# Patient Record
Sex: Female | Born: 1985 | Race: Asian | Hispanic: No | Marital: Married | Smoking: Never smoker
Health system: Southern US, Community
[De-identification: ages and names within clinical notes are randomized; demographics above are authoritative.]

## PROBLEM LIST (undated history)

## (undated) DIAGNOSIS — O99019 Anemia complicating pregnancy, unspecified trimester: Secondary | ICD-10-CM

## (undated) DIAGNOSIS — D509 Iron deficiency anemia, unspecified: Secondary | ICD-10-CM

## (undated) DIAGNOSIS — Z789 Other specified health status: Secondary | ICD-10-CM

## (undated) DIAGNOSIS — D62 Acute posthemorrhagic anemia: Secondary | ICD-10-CM

## (undated) HISTORY — PX: HERNIA REPAIR: SHX51

---

## 2007-10-04 ENCOUNTER — Emergency Department (HOSPITAL_COMMUNITY): Admission: EM | Admit: 2007-10-04 | Discharge: 2007-10-05 | Payer: Self-pay | Admitting: Emergency Medicine

## 2011-10-10 LAB — BASIC METABOLIC PANEL
BUN: 7
Creatinine, Ser: 0.75
GFR calc Af Amer: >60
GFR calc non Af Amer: >60
Potassium: 3.3 — ABNORMAL LOW
Sodium: 135

## 2011-10-10 LAB — URINALYSIS, ROUTINE W REFLEX MICROSCOPIC
Bilirubin Urine: NEGATIVE
Glucose, UA: NEGATIVE
Hgb urine dipstick: NEGATIVE
Nitrite: NEGATIVE
Urobilinogen, UA: 0.2
pH: 7.5

## 2011-10-10 LAB — CBC
MCHC: 34.4
RBC: 4.4
RDW: 11.9

## 2011-10-10 LAB — DIFFERENTIAL
Basophils Absolute: 0
Lymphocytes Relative: 31
Monocytes Absolute: 0.5

## 2011-10-10 LAB — PREGNANCY, URINE: Preg Test, Ur: NEGATIVE

## 2013-08-26 ENCOUNTER — Other Ambulatory Visit: Payer: Self-pay | Admitting: Chiropractic Medicine

## 2013-08-26 ENCOUNTER — Ambulatory Visit
Admission: RE | Admit: 2013-08-26 | Discharge: 2013-08-26 | Disposition: A | Discharge status: Home / Self Care | Payer: BC Managed Care – PPO | Source: Ambulatory Visit | Attending: Chiropractic Medicine | Admitting: Chiropractic Medicine

## 2013-08-26 DIAGNOSIS — S93401A Sprain of unspecified ligament of right ankle, initial encounter: Secondary | ICD-10-CM

## 2013-12-30 NOTE — L&D Delivery Note (Signed)
Delivery Note At 7:08 AM a viable healthy but SGA female was delivered via Vaginal, Spontaneous Delivery (Presentation: Right Occiput Anterior).  APGAR: 8 and 9; weight 5 lb 7 oz.   Placenta status: Intact, Spontaneous Pathology.  Cord: 3 vessels with the following complications: None.  Cord pH: N/A  Anesthesia: Epidural  Episiotomy: Right Mediolateral 1.5 cm long  Lacerations: None Suture Repair: 3.0 vicryl rapide Est. Blood Loss (mL):  300 cc plus old clots about 100 cc   Mom to postpartum.  Baby to Couplet care / Skin to Skin.  Jamara Vary R 10/30/2014, 7:49 AM

## 2014-04-12 LAB — OB RESULTS CONSOLE RPR: RPR: NONREACTIVE

## 2014-04-12 LAB — OB RESULTS CONSOLE HIV ANTIBODY (ROUTINE TESTING): HIV: NONREACTIVE

## 2014-04-12 LAB — OB RESULTS CONSOLE HEPATITIS B SURFACE ANTIGEN: HEP B S AG: NEGATIVE

## 2014-04-12 LAB — OB RESULTS CONSOLE RUBELLA ANTIBODY, IGM: Rubella: IMMUNE

## 2014-04-21 LAB — OB RESULTS CONSOLE GC/CHLAMYDIA
CHLAMYDIA, DNA PROBE: NEGATIVE
GC PROBE AMP, GENITAL: NEGATIVE

## 2014-07-13 ENCOUNTER — Inpatient Hospital Stay (HOSPITAL_COMMUNITY)
Admission: AD | Admit: 2014-07-13 | Payer: BC Managed Care – PPO | Source: Ambulatory Visit | Admitting: Obstetrics and Gynecology

## 2014-10-12 LAB — OB RESULTS CONSOLE GBS: GBS: NEGATIVE

## 2014-10-29 ENCOUNTER — Inpatient Hospital Stay (HOSPITAL_COMMUNITY): Payer: BC Managed Care – PPO | Admitting: Anesthesiology

## 2014-10-29 ENCOUNTER — Encounter (HOSPITAL_COMMUNITY): Payer: BC Managed Care – PPO | Admitting: Anesthesiology

## 2014-10-29 ENCOUNTER — Inpatient Hospital Stay (HOSPITAL_COMMUNITY)
Admission: AD | Admit: 2014-10-29 | Discharge: 2014-11-01 | DRG: 774 | Disposition: A | Discharge status: Home / Self Care | Payer: BC Managed Care – PPO | Source: Ambulatory Visit | Attending: Obstetrics & Gynecology | Admitting: Obstetrics & Gynecology

## 2014-10-29 ENCOUNTER — Observation Stay (HOSPITAL_COMMUNITY): Payer: BC Managed Care – PPO

## 2014-10-29 ENCOUNTER — Encounter (HOSPITAL_COMMUNITY): Payer: Self-pay

## 2014-10-29 DIAGNOSIS — O9081 Anemia of the puerperium: Secondary | ICD-10-CM | POA: Diagnosis not present

## 2014-10-29 DIAGNOSIS — O358XX0 Maternal care for other (suspected) fetal abnormality and damage, not applicable or unspecified: Secondary | ICD-10-CM | POA: Diagnosis present

## 2014-10-29 DIAGNOSIS — Z3A38 38 weeks gestation of pregnancy: Secondary | ICD-10-CM | POA: Diagnosis present

## 2014-10-29 DIAGNOSIS — O4593 Premature separation of placenta, unspecified, third trimester: Secondary | ICD-10-CM | POA: Diagnosis present

## 2014-10-29 DIAGNOSIS — O1403 Mild to moderate pre-eclampsia, third trimester: Secondary | ICD-10-CM | POA: Diagnosis present

## 2014-10-29 DIAGNOSIS — O4690 Antepartum hemorrhage, unspecified, unspecified trimester: Secondary | ICD-10-CM | POA: Diagnosis present

## 2014-10-29 DIAGNOSIS — D62 Acute posthemorrhagic anemia: Secondary | ICD-10-CM | POA: Diagnosis not present

## 2014-10-29 DIAGNOSIS — O4693 Antepartum hemorrhage, unspecified, third trimester: Secondary | ICD-10-CM

## 2014-10-29 HISTORY — DX: Other specified health status: Z78.9

## 2014-10-29 LAB — CBC
HCT: 37.2 % (ref 36.0–46.0)
HCT: 32.5 % — ABNORMAL LOW (ref 36.0–46.0)
Hemoglobin: 13.4 g/dL (ref 12.0–15.0)
Hemoglobin: 11.4 g/dL — ABNORMAL LOW (ref 12.0–15.0)
MCH: 32.3 pg (ref 26.0–34.0)
MCH: 31.4 pg (ref 26.0–34.0)
MCHC: 36 g/dL (ref 30.0–36.0)
MCHC: 35.1 g/dL (ref 30.0–36.0)
MCV: 89.6 fL (ref 78.0–100.0)
MCV: 89.5 fL (ref 78.0–100.0)
PLATELETS: 169 10*3/uL (ref 150–400)
Platelets: 172 10*3/uL (ref 150–400)
RBC: 4.15 MIL/uL (ref 3.87–5.11)
RBC: 3.63 MIL/uL — AB (ref 3.87–5.11)
RDW: 12.8 % (ref 11.5–15.5)
RDW: 12.7 % (ref 11.5–15.5)
WBC: 12.6 10*3/uL — ABNORMAL HIGH (ref 4.0–10.5)
WBC: 11.8 10*3/uL — AB (ref 4.0–10.5)

## 2014-10-29 LAB — PROTEIN / CREATININE RATIO, URINE
CREATININE, URINE: 47.89 mg/dL
PROTEIN CREATININE RATIO: 0.1 (ref 0.00–0.15)
Total Protein, Urine: 4.9 mg/dL

## 2014-10-29 LAB — FIBRINOGEN
Fibrinogen: 412 mg/dL (ref 204–475)
Fibrinogen: 421 mg/dL (ref 204–475)

## 2014-10-29 LAB — APTT: aPTT: 33 seconds (ref 24–37)

## 2014-10-29 LAB — COMPREHENSIVE METABOLIC PANEL
ALT: 15 U/L (ref 0–35)
AST: 28 U/L (ref 0–37)
Albumin: 3.2 g/dL — ABNORMAL LOW (ref 3.5–5.2)
Alkaline Phosphatase: 162 U/L — ABNORMAL HIGH (ref 39–117)
Anion gap: 12 (ref 5–15)
BILIRUBIN TOTAL: 0.4 mg/dL (ref 0.3–1.2)
BUN: 12 mg/dL (ref 6–23)
CALCIUM: 10.1 mg/dL (ref 8.4–10.5)
CHLORIDE: 102 meq/L (ref 96–112)
CO2: 22 meq/L (ref 19–32)
CREATININE: 0.73 mg/dL (ref 0.50–1.10)
GFR calc Af Amer: >90 mL/min (ref >90)
Glucose, Bld: 95 mg/dL (ref 70–99)
Potassium: 4.1 mEq/L (ref 3.7–5.3)
Sodium: 136 mEq/L — ABNORMAL LOW (ref 137–147)
Total Protein: 6.8 g/dL (ref 6.0–8.3)

## 2014-10-29 LAB — PROTIME-INR
INR: 1.02 (ref 0.00–1.49)
Prothrombin Time: 13.5 seconds (ref 11.6–15.2)

## 2014-10-29 LAB — TYPE AND SCREEN
ABO/RH(D): O POS
ANTIBODY SCREEN: NEGATIVE

## 2014-10-29 LAB — SAVE SMEAR

## 2014-10-29 LAB — RPR

## 2014-10-29 LAB — LACTATE DEHYDROGENASE: LDH: 189 U/L (ref 94–250)

## 2014-10-29 LAB — URIC ACID: URIC ACID, SERUM: 5.9 mg/dL (ref 2.4–7.0)

## 2014-10-29 LAB — ABO/RH: ABO/RH(D): O POS

## 2014-10-29 MED ORDER — CITRIC ACID-SODIUM CITRATE 334-500 MG/5ML PO SOLN: 30.0000 mL | ORAL | Status: DC | PRN

## 2014-10-29 MED ORDER — OXYTOCIN BOLUS FROM INFUSION: 500.0000 mL | INTRAVENOUS | Status: DC

## 2014-10-29 MED ORDER — FENTANYL 2.5 MCG/ML BUPIVACAINE 1/10 % EPIDURAL INFUSION (WH - ANES)
INTRAMUSCULAR | Status: DC | PRN
  Administered 2014-10-29: 14 mL/h via EPIDURAL

## 2014-10-29 MED ORDER — PHENYLEPHRINE 40 MCG/ML (10ML) SYRINGE FOR IV PUSH (FOR BLOOD PRESSURE SUPPORT)
PREFILLED_SYRINGE | INTRAVENOUS | Status: AC
  Filled 2014-10-29: qty 5

## 2014-10-29 MED ORDER — LACTATED RINGERS IV SOLN
INTRAVENOUS | Status: DC
  Administered 2014-10-29: 22:00:00 via INTRAVENOUS

## 2014-10-29 MED ORDER — DIPHENHYDRAMINE HCL 50 MG/ML IJ SOLN: 12.5000 mg | INTRAMUSCULAR | Status: DC | PRN

## 2014-10-29 MED ORDER — TERBUTALINE SULFATE 1 MG/ML IJ SOLN
0.2500 mg | Freq: Once | INTRAMUSCULAR | Status: AC
  Administered 2014-10-29: 12:00:00 via SUBCUTANEOUS

## 2014-10-29 MED ORDER — EPHEDRINE 5 MG/ML INJ
10.0000 mg | INTRAVENOUS | Status: DC | PRN
  Filled 2014-10-29: qty 2

## 2014-10-29 MED ORDER — LACTATED RINGERS IV SOLN: 500.0000 mL | INTRAVENOUS | Status: DC | PRN

## 2014-10-29 MED ORDER — OXYCODONE-ACETAMINOPHEN 5-325 MG PO TABS: 1.0000 | ORAL_TABLET | ORAL | Status: DC | PRN

## 2014-10-29 MED ORDER — PHENYLEPHRINE 40 MCG/ML (10ML) SYRINGE FOR IV PUSH (FOR BLOOD PRESSURE SUPPORT)
80.0000 ug | PREFILLED_SYRINGE | INTRAVENOUS | Status: DC | PRN
  Filled 2014-10-29: qty 2

## 2014-10-29 MED ORDER — PHENYLEPHRINE 40 MCG/ML (10ML) SYRINGE FOR IV PUSH (FOR BLOOD PRESSURE SUPPORT)
80.0000 ug | PREFILLED_SYRINGE | INTRAVENOUS | Status: DC | PRN
Start: 2014-10-29 — End: 2014-10-30
  Filled 2014-10-29: qty 2

## 2014-10-29 MED ORDER — TERBUTALINE SULFATE 1 MG/ML IJ SOLN
INTRAMUSCULAR | Status: AC
  Filled 2014-10-29: qty 1

## 2014-10-29 MED ORDER — ACETAMINOPHEN 325 MG PO TABS: 650.0000 mg | ORAL_TABLET | ORAL | Status: DC | PRN

## 2014-10-29 MED ORDER — LIDOCAINE HCL (PF) 1 % IJ SOLN
INTRAMUSCULAR | Status: DC | PRN
  Administered 2014-10-29: 7 mL
  Administered 2014-10-29: 8 mL

## 2014-10-29 MED ORDER — FENTANYL 2.5 MCG/ML BUPIVACAINE 1/10 % EPIDURAL INFUSION (WH - ANES)
INTRAMUSCULAR | Status: AC
  Filled 2014-10-29: qty 125

## 2014-10-29 MED ORDER — LACTATED RINGERS IV SOLN
500.0000 mL | Freq: Once | INTRAVENOUS | Status: AC
  Administered 2014-10-29: 500 mL via INTRAVENOUS

## 2014-10-29 MED ORDER — ONDANSETRON HCL 4 MG/2ML IJ SOLN: 4.0000 mg | Freq: Four times a day (QID) | INTRAMUSCULAR | Status: DC | PRN

## 2014-10-29 MED ORDER — LIDOCAINE HCL (PF) 1 % IJ SOLN
30.0000 mL | INTRAMUSCULAR | Status: DC | PRN
Start: 2014-10-29 — End: 2014-10-30
  Filled 2014-10-29 (×2): qty 30

## 2014-10-29 MED ORDER — FENTANYL 2.5 MCG/ML BUPIVACAINE 1/10 % EPIDURAL INFUSION (WH - ANES)
14.0000 mL/h | INTRAMUSCULAR | Status: DC | PRN
  Administered 2014-10-29: 14 mL/h via EPIDURAL
  Filled 2014-10-29: qty 125

## 2014-10-29 MED ORDER — OXYTOCIN 40 UNITS IN LACTATED RINGERS INFUSION - SIMPLE MED
62.5000 mL/h | INTRAVENOUS | Status: DC
  Filled 2014-10-29: qty 1000

## 2014-10-29 MED ORDER — OXYCODONE-ACETAMINOPHEN 5-325 MG PO TABS: 2.0000 | ORAL_TABLET | ORAL | Status: DC | PRN

## 2014-10-29 NOTE — Progress Notes (Signed)
Patient ID: Lora PaulaWinnie Yu-Ting Campisi, female   DOB: April 10, 1986, 28 y.o.   MRN: [id number] Feels well, no back pain now. UCs spaced out to q 2-3 min since Terbutaline.  VS- BPs still elevated but denies symptoms.  PIH labs normal, (UA pending), Fibrinogen, H/H, platelets normal.  Abdo soft, non tender. FHT 150s/ + accels/ no decels/ moderate variability- category I.  Continue to monitor, plan sono for AFI and placenta after 6 hrs if all bleeding resolved.  Pt agrees.  V.Freida Nebel, MD

## 2014-10-29 NOTE — Progress Notes (Addendum)
Lora PaulaWinnie Yu-Ting Wilcoxson is a 28 y.o. G1P0 at 8433w1d. Admitted with 3rd trimester bleeding that started this morning with back cramping. Suspected abruption. Denies pain or discomfort with contractions. Bleeding continues that is dark blood and mainly staining pad but no more active bleeding noted.   VS increased BP, esp systolic pressures but pt is asymptomatic. After about 12 hr continuous monitoring no fetal decels noted, FHT- category I. Toco- spontaneous every 2-3 min. Uterus soft, non tender.  Cx 3-4/70%/-1 to 0/ Vtx  Sono reviewed and d/w pt. A large 13x5.7 x3.5 cm clot noted anteriorly between membranes and the anterior uterine wall but no clot noted behind the placenta, d/w MFM Dr Sallee LangeNitchie, possible marginal abruptio and bleeding was concealed for sometime (old clot like appearance on sono) and started to leak blood today.   A/P: 38/1 wks, 3rd trim bleeding, suspect marginal abruption with bleeding and large blood clot b/w membranes and uterus.  Recommend delivery by vaginal trial since 12 hr monitoring noted no fetal compromise or non- reassuring status. Reviewed risk of abruption/ possible emergency C/s.  Plan AROM, Epidural ok, GBS(-).  Plan reviewed with patient and husband, understand and agree.    Deklynn Charlet R 10/29/2014, 10:00 PM  Repeat labs- CBC/ platelets/ fibrinogen / PT, INR normal

## 2014-10-29 NOTE — H&P (Signed)
Kelsey [name] is a 28 y.o. female presenting with vaginal bleeding. Gush of fluid noted this morning was all bloody.   G1 at 38.1 wks, healthy, non smoker, no HTN in pregnancy though gives hx of elevated BP with OCs and returned to normal when stopped OCs. No HA/ abdo pain or vision changes but c/o back pain and abdominal cramping since woke up this morning. She felt "gush of fluid" when she sneezed after she ate breakfast this morning but noted it was blood and not clear fluid. Patient c/o back pain more than contractions but is contracting every minute. Bleeding continued since leaving home and is still present.   [facility], Dr Ernestina PennaFogleman is primary. 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 Normal growth though recently noted S<D and growth sono is scheduled next week.   History OB History   Grav Para Term Preterm Abortions TAB SAB Ect Mult Living   1              History reviewed. No pertinent past medical history. History reviewed. No pertinent past surgical history. Family History: family history is not on file. Social History:  reports that she has never smoked. She has never used smokeless tobacco. She reports that she does not drink alcohol or use illicit drugs.   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 Exam Physical Exam  Serial BPs in  MAU are all elevated, pt is anxious but BPs concerning due to suspected abruption  A&O x 3, no acute distress. Pleasant HEENT neg, no thyromegaly Lungs CTA bilat CV RRR, S1S2 normal Abdo Gravid, non tender uterus, easy to palpate, contractions noted but relaxed in between and soft.  Extr no edema/ tenderness Pelvic Bleeding +++, trickles from introitus with contractions.  FHT 150s/ + accels/ no decels/ moderate variab- category I Toco every minute   Prenatal labs: ABO, Rh:  O(+) Antibody:  negative Rubella:  Immune RPR:   Neg HBsAg:   Neg HIV:   Neg  GBS:   neg  Glucola normal  Ultrascreen negative. Informaseq normal, XX  Assessment/Plan: 28 yo, G1 at 38.1 wks with acute onset bleeding and contractions suspecting placenta abruption but no fetal affection since FHT in category I Terbutaline 0.25 mg [place] given in MAU and noted contractions spacing out. Admit. Observe for further bleeding, CBC, T&S, Fibrinogen  Elevated BP, checking for PEC with labs, continue serial BPs.  NPO. OR informed.  Reviewed findings and maternal/fetal concerns with patient and husband, recommend admission. Plan sono for AFI and placenta if bleeding resolved as UCs space out. Planning delivery but no active induction intervention at present.  Faaris Arizpe R 10/29/2014, 12:14 PM

## 2014-10-29 NOTE — Anesthesia Preprocedure Evaluation (Signed)

## 2014-10-29 NOTE — MAU Note (Signed)
Pt states around 0945 sneezed and felt lof. Since that time, pt has felt intermittent trickles of fluid.

## 2014-10-29 NOTE — Anesthesia Procedure Notes (Signed)
Epidural Patient location during procedure: OB Start time: 10/29/2014 11:26 PM End time: 10/29/2014 11:30 PM  Staffing Anesthesiologist: Leilani AbleHATCHETT, Joshiah Traynham Performed by: anesthesiologist   Preanesthetic Checklist Completed: patient identified, surgical consent, pre-op evaluation, timeout performed, IV checked, risks and benefits discussed and monitors and equipment checked  Epidural Patient position: sitting Prep: site prepped and draped and DuraPrep Patient monitoring: continuous pulse ox and blood pressure Approach: midline Location: L3-L4 Injection technique: LOR air  Needle:  Needle type: Tuohy  Needle gauge: 17 G Needle length: 9 cm and 9 Needle insertion depth: 4 cm Catheter type: closed end flexible Catheter size: 19 Gauge Catheter at skin depth: 9 cm Test dose: negative and Other  Assessment Sensory level: T9 Events: blood not aspirated, injection not painful, no injection resistance, negative IV test and no paresthesia  Additional Notes Reason for block:procedure for pain

## 2014-10-29 NOTE — Progress Notes (Signed)
Dr Juliene PinaMody notified of pt's arrival and VE, FHR, VE and vaginal bleeding states that she will come in a evaluate pt.

## 2014-10-30 ENCOUNTER — Encounter (HOSPITAL_COMMUNITY): Payer: Self-pay | Admitting: *Deleted

## 2014-10-30 DIAGNOSIS — O4593 Premature separation of placenta, unspecified, third trimester: Secondary | ICD-10-CM | POA: Diagnosis present

## 2014-10-30 LAB — CBC
HCT: 31.2 % — ABNORMAL LOW (ref 36.0–46.0)
HCT: 30 % — ABNORMAL LOW (ref 36.0–46.0)
HEMOGLOBIN: 10.5 g/dL — AB (ref 12.0–15.0)
Hemoglobin: 11.2 g/dL — ABNORMAL LOW (ref 12.0–15.0)
MCH: 32.4 pg (ref 26.0–34.0)
MCH: 31.5 pg (ref 26.0–34.0)
MCHC: 35.9 g/dL (ref 30.0–36.0)
MCHC: 35 g/dL (ref 30.0–36.0)
MCV: 90.2 fL (ref 78.0–100.0)
MCV: 90.1 fL (ref 78.0–100.0)
PLATELETS: 149 10*3/uL — AB (ref 150–400)
Platelets: 144 10*3/uL — ABNORMAL LOW (ref 150–400)
RBC: 3.46 MIL/uL — AB (ref 3.87–5.11)
RBC: 3.33 MIL/uL — AB (ref 3.87–5.11)
RDW: 12.9 % (ref 11.5–15.5)
RDW: 12.9 % (ref 11.5–15.5)
WBC: 11.5 10*3/uL — ABNORMAL HIGH (ref 4.0–10.5)
WBC: 10.2 10*3/uL (ref 4.0–10.5)

## 2014-10-30 LAB — APTT: APTT: 35 s (ref 24–37)

## 2014-10-30 LAB — FIBRINOGEN: Fibrinogen: 385 mg/dL (ref 204–475)

## 2014-10-30 LAB — PROTIME-INR
INR: 1.01 (ref 0.00–1.49)
Prothrombin Time: 13.4 seconds (ref 11.6–15.2)

## 2014-10-30 MED ORDER — SIMETHICONE 80 MG PO CHEW: 80.0000 mg | CHEWABLE_TABLET | ORAL | Status: DC | PRN

## 2014-10-30 MED ORDER — PRENATAL MULTIVITAMIN CH
1.0000 | ORAL_TABLET | Freq: Every day | ORAL | Status: DC
  Administered 2014-10-30: 1 via ORAL
  Filled 2014-10-30: qty 1

## 2014-10-30 MED ORDER — ONDANSETRON HCL 4 MG PO TABS: 4.0000 mg | ORAL_TABLET | ORAL | Status: DC | PRN

## 2014-10-30 MED ORDER — OXYCODONE-ACETAMINOPHEN 5-325 MG PO TABS: 1.0000 | ORAL_TABLET | ORAL | Status: DC | PRN

## 2014-10-30 MED ORDER — TETANUS-DIPHTH-ACELL PERTUSSIS 5-2.5-18.5 LF-MCG/0.5 IM SUSP: 0.5000 mL | Freq: Once | INTRAMUSCULAR | Status: DC

## 2014-10-30 MED ORDER — OXYTOCIN 10 UNIT/ML IJ SOLN
INTRAMUSCULAR | Status: AC
  Filled 2014-10-30: qty 2

## 2014-10-30 MED ORDER — IBUPROFEN 600 MG PO TABS
600.0000 mg | ORAL_TABLET | Freq: Four times a day (QID) | ORAL | Status: DC
  Administered 2014-10-30 – 2014-11-01 (×9): 600 mg via ORAL
  Filled 2014-10-30 (×10): qty 1

## 2014-10-30 MED ORDER — ONDANSETRON HCL 4 MG/2ML IJ SOLN: 4.0000 mg | INTRAMUSCULAR | Status: DC | PRN

## 2014-10-30 MED ORDER — TERBUTALINE SULFATE 1 MG/ML IJ SOLN: 0.2500 mg | Freq: Once | INTRAMUSCULAR | Status: DC | PRN

## 2014-10-30 MED ORDER — LANOLIN HYDROUS EX OINT: TOPICAL_OINTMENT | CUTANEOUS | Status: DC | PRN

## 2014-10-30 MED ORDER — BENZOCAINE-MENTHOL 20-0.5 % EX AERO
1.0000 "application " | INHALATION_SPRAY | CUTANEOUS | Status: DC | PRN
  Administered 2014-10-30: 1 via TOPICAL
  Filled 2014-10-30: qty 56

## 2014-10-30 MED ORDER — OXYTOCIN 40 UNITS IN LACTATED RINGERS INFUSION - SIMPLE MED
1.0000 m[IU]/min | INTRAVENOUS | Status: DC
  Administered 2014-10-30: 1 m[IU]/min via INTRAVENOUS

## 2014-10-30 MED ORDER — WITCH HAZEL-GLYCERIN EX PADS: 1.0000 "application " | MEDICATED_PAD | CUTANEOUS | Status: DC | PRN

## 2014-10-30 MED ORDER — DIPHENHYDRAMINE HCL 25 MG PO CAPS: 25.0000 mg | ORAL_CAPSULE | Freq: Four times a day (QID) | ORAL | Status: DC | PRN

## 2014-10-30 MED ORDER — SENNOSIDES-DOCUSATE SODIUM 8.6-50 MG PO TABS
2.0000 | ORAL_TABLET | ORAL | Status: DC
  Administered 2014-10-30: 2 via ORAL
  Filled 2014-10-30: qty 2

## 2014-10-30 MED ORDER — DIBUCAINE 1 % RE OINT: 1.0000 "application " | TOPICAL_OINTMENT | RECTAL | Status: DC | PRN

## 2014-10-30 NOTE — Progress Notes (Signed)
When assisting with pt position change, RN noticed bright red bleeding noted on pillow that was placed behind her back.On assessment bleeding appeared to be from Epidural site. Dr. Juliene PinaMody and Dr. Arby BarretteHatchett notified, labs ordered

## 2014-10-30 NOTE — Progress Notes (Signed)
Patient ID: Kelsey [name], female   DOB: January 06, 1986, 28 y.o.   MRN: [id number] Epidural site corresponding on gown and bed noted to have blood, RN informed me and DIC labs done, were normal. CNA aware, will repeat labs, holding pressure for low but epidural site gauze didn't have much blood.  No clinical evidence of DIC.

## 2014-10-30 NOTE — Progress Notes (Signed)
EST started

## 2014-10-31 ENCOUNTER — Encounter (HOSPITAL_COMMUNITY): Payer: Self-pay | Admitting: *Deleted

## 2014-10-31 LAB — COMPREHENSIVE METABOLIC PANEL
ALBUMIN: 2.5 g/dL — AB (ref 3.5–5.2)
ALT: 17 U/L (ref 0–35)
AST: 42 U/L — ABNORMAL HIGH (ref 0–37)
Alkaline Phosphatase: 100 U/L (ref 39–117)
Anion gap: 8 (ref 5–15)
BUN: 13 mg/dL (ref 6–23)
CO2: 25 mEq/L (ref 19–32)
CREATININE: 0.87 mg/dL (ref 0.50–1.10)
Calcium: 8.4 mg/dL (ref 8.4–10.5)
Chloride: 105 mEq/L (ref 96–112)
GFR calc Af Amer: >90 mL/min (ref >90)
GFR, EST NON AFRICAN AMERICAN: 89 mL/min — AB (ref >90)
Glucose, Bld: 73 mg/dL (ref 70–99)
Potassium: 3.8 mEq/L (ref 3.7–5.3)
Sodium: 138 mEq/L (ref 137–147)
Total Bilirubin: 0.2 mg/dL — ABNORMAL LOW (ref 0.3–1.2)
Total Protein: 5.2 g/dL — ABNORMAL LOW (ref 6.0–8.3)

## 2014-10-31 LAB — CBC
HCT: 25 % — ABNORMAL LOW (ref 36.0–46.0)
HEMOGLOBIN: 8.7 g/dL — AB (ref 12.0–15.0)
MCH: 31.3 pg (ref 26.0–34.0)
MCHC: 34.8 g/dL (ref 30.0–36.0)
MCV: 89.9 fL (ref 78.0–100.0)
Platelets: 130 10*3/uL — ABNORMAL LOW (ref 150–400)
RBC: 2.78 MIL/uL — ABNORMAL LOW (ref 3.87–5.11)
RDW: 13.1 % (ref 11.5–15.5)
WBC: 9.2 10*3/uL (ref 4.0–10.5)

## 2014-10-31 LAB — URIC ACID: URIC ACID, SERUM: 5.9 mg/dL (ref 2.4–7.0)

## 2014-10-31 MED ORDER — COMPLETENATE 29-1 MG PO CHEW
1.0000 | CHEWABLE_TABLET | Freq: Every day | ORAL | Status: DC
Start: 2014-10-31 — End: 2014-11-01
  Administered 2014-10-31 – 2014-11-01 (×2): 1 via ORAL
  Filled 2014-10-31 (×3): qty 1

## 2014-10-31 MED ORDER — DOCUSATE SODIUM 100 MG PO CAPS
100.0000 mg | ORAL_CAPSULE | Freq: Two times a day (BID) | ORAL | Status: DC
  Administered 2014-10-31 – 2014-11-01 (×3): 100 mg via ORAL
  Filled 2014-10-31 (×3): qty 1

## 2014-10-31 MED ORDER — LABETALOL HCL 5 MG/ML IV SOLN
10.0000 mg | Freq: Once | INTRAVENOUS | Status: AC
  Administered 2014-10-31: 10 mg via INTRAVENOUS
  Filled 2014-10-31: qty 4

## 2014-10-31 MED ORDER — LABETALOL HCL 200 MG PO TABS
200.0000 mg | ORAL_TABLET | Freq: Three times a day (TID) | ORAL | Status: DC
  Administered 2014-10-31 – 2014-11-01 (×4): 200 mg via ORAL
  Filled 2014-10-31 (×4): qty 1

## 2014-10-31 NOTE — Lactation Note (Signed)
This note was copied from the chart of Kelsey Kristie CowmanWinnie Wild. Lactation Consultation Note New mom has small breast w/good everted nipples. Mom states baby latches well for most part but will slip down to end of nipple. Demonstrated proper latch, chin tug, and repositioning to obtain deeper latch. Demonstrated  Football hold, mom liked that position. Heard swallows. Instructed breast massage and hand expression, noted colostrum. FOB at bedside w/good support. Mom encouraged to feed baby 8-12 times/24 hours and with feeding cues. Educated about newborn behavior, baby is SGA, stressed waking baby for feedings if haven't cued in 3 hrs. To eat. Referred to Baby and Me Book in Breastfeeding section Pg. 22-23 for position options and Proper latch demonstration.Mom reports + breast changes w/pregnancy. Mom encouraged to do skin-to-skin. WH/LC brochure given w/resources, support groups and LC services. Informed about educational channels on TV for BF teaching while in hospital.  Patient Name: Kelsey [name] Today's Date: 10/31/2014 Reason for consult: Initial assessment   Maternal Data    Feeding Feeding Type: Breast Fed Length of feed: 20 min  LATCH Score/Interventions Latch: Grasps breast easily, tongue down, lips flanged, rhythmical sucking. Intervention(s): Adjust position;Assist with latch;Breast massage;Breast compression  Audible Swallowing: A few with stimulation Intervention(s): Skin to skin;Hand expression;Alternate breast massage  Type of Nipple: Everted at rest and after stimulation  Comfort (Breast/Nipple): Soft / non-tender     Hold (Positioning): Assistance needed to correctly position infant at breast and maintain latch. Intervention(s): Breastfeeding basics reviewed;Support Pillows;Position options;Skin to skin  LATCH Score: 8  Lactation Tools Discussed/Used     Consult Status Consult Status: Follow-up Date: 10/31/14 Follow-up type: In-patient    Charyl DancerCARVER, Carrin Vannostrand  G 10/31/2014, 4:44 AM

## 2014-10-31 NOTE — Lactation Note (Signed)
This note was copied from the chart of Kelsey Kristie CowmanWinnie Tapper. Lactation Consultation Note  Assisted mom with football hold.  Baby showing feeding cues and latched easily to breast.  Baby nursed actively with stimulation and breast massage.  A lot of teaching done and questions answered.  Instructed to watch for cues and post pump every 3 hours.  Encouraged to call for concerns/assist.  Patient Name: Kelsey [name] ZOXWR'UToday's Date: 10/31/2014 Reason for consult: Follow-up assessment;Infant < 6lbs   Maternal Data    Feeding    LATCH Score/Interventions Latch: Grasps breast easily, tongue down, lips flanged, rhythmical sucking. Intervention(s): Adjust position;Assist with latch;Breast massage;Breast compression  Audible Swallowing: A few with stimulation Intervention(s): Alternate breast massage  Type of Nipple: Everted at rest and after stimulation  Comfort (Breast/Nipple): Soft / non-tender     Hold (Positioning): Assistance needed to correctly position infant at breast and maintain latch. Intervention(s): Breastfeeding basics reviewed;Support Pillows  LATCH Score: 8  Lactation Tools Discussed/Used Pump Review: Setup, frequency, and cleaning;Milk Storage Initiated by:: LMOULDEN RN,IBCLC Date initiated:: 10/31/14   Consult Status Consult Status: Follow-up Date: 11/01/14 Follow-up type: In-patient    Huston FoleyMOULDEN, Sona Nations S 10/31/2014, 4:24 PM

## 2014-10-31 NOTE — Anesthesia Postprocedure Evaluation (Signed)
  Anesthesia Post-op Note  Anesthesia Post Note  Patient: Kelsey [name]  Procedure(s) Performed: * No procedures listed *  Anesthesia type: Epidural  Patient location: Mother/Baby  Post pain: Pain level controlled  Post assessment: Post-op Vital signs reviewed  Last Vitals:  Filed Vitals:   10/31/14 0544  BP: 133/92  Pulse: 57  Temp: 36.8 C  Resp: 17    Post vital signs: Reviewed  Level of consciousness:alert  Complications: No apparent anesthesia complications

## 2014-10-31 NOTE — Progress Notes (Signed)
Pt notes no dizziness w/ standing, no HA, no scotomata, no vision change, no RUQ pain. No BM yet. Nl void, breastfeeding adequately, no RUQ pain, no emesis, overall decreases appetite.  PE: Filed Vitals:   10/30/14 1600 10/30/14 1731 10/30/14 2125 10/31/14 0544  BP: 150/85 145/90 153/91 133/92  Pulse: 72 103 67 57  Temp: 98.8 F (37.1 C) 98.4 F (36.9 C) 97.9 F (36.6 C) 98.3 F (36.8 C)  TempSrc: Oral Oral Oral Oral  Resp: 16 18 18 17   Height:      Weight:        Gen: well appearng, no distress CV: RRR Pulm: CTAB Back: no CVAT Abd: no RUQ pain/ tenderness, fundud NT LE: no edema. 2+ DTR, no clonus  CBC    Component Value Date/Time   WBC 9.2 10/31/2014 0610   RBC 2.78* 10/31/2014 0610   HGB 8.7* 10/31/2014 0610   HCT 25.0* 10/31/2014 0610   PLT 130* 10/31/2014 0610   MCV 89.9 10/31/2014 0610   MCH 31.3 10/31/2014 0610   MCHC 34.8 10/31/2014 0610   RDW 13.1 10/31/2014 0610   LYMPHSABS 1.8 10/05/2007 0200   MONOABS 0.5 10/05/2007 0200   EOSABS 0.0 10/05/2007 0200   BASOSABS 0.0 10/05/2007 0200     CMP     Component Value Date/Time   NA 138 10/31/2014 0610   K 3.8 10/31/2014 0610   CL 105 10/31/2014 0610   CO2 25 10/31/2014 0610   GLUCOSE 73 10/31/2014 0610   BUN 13 10/31/2014 0610   CREATININE 0.87 10/31/2014 0610   CALCIUM 8.4 10/31/2014 0610   PROT 5.2* 10/31/2014 0610   ALBUMIN 2.5* 10/31/2014 0610   AST 42* 10/31/2014 0610   ALT 17 10/31/2014 0610   ALKPHOS 100 10/31/2014 0610   BILITOT 0.2* 10/31/2014 0610   GFRNONAA 89* 10/31/2014 0610   GFRAA >90 10/31/2014 0610    A/P: PPD#1 s/p SVD after IOL for vaginal bleeding and marginal abruption now with elevated bp's intra and post-partum - likely PEC, not severe. Mild BP elevations and no symptoms. Increase in AST today and drop in plt and Hgb likely result of improving hemoconcentration. Will cont to watch closely, q 4 hr bps, repeat PIH labs in am. Sx of PEC and warning signs d/w pt.  - anemia. Resume  PNV with iron, start additional iron at home once resuming nl BM. Pt asymptomatic  Kelsey [name] A. 10/31/2014 9:01 AM

## 2014-10-31 NOTE — Plan of Care (Signed)
Problem: Consults Goal: Postpartum Patient Education (See Patient Education module for education specifics.)  Outcome: Completed/Met Date Met:  10/31/14  Problem: Phase I Progression Outcomes Goal: Pain controlled with appropriate interventions Outcome: Completed/Met Date Met:  10/31/14 Goal: Voiding adequately Outcome: Completed/Met Date Met:  10/31/14 Goal: OOB as tolerated unless otherwise ordered Outcome: Completed/Met Date Met:  10/31/14 Goal: Initial discharge plan identified Outcome: Completed/Met Date Met:  10/31/14 Goal: Other Phase I Outcomes/Goals Outcome: Completed/Met Date Met:  10/31/14  Problem: Phase II Progression Outcomes Goal: Pain controlled on oral analgesia Outcome: Completed/Met Date Met:  10/31/14 Goal: Progress activity as tolerated unless otherwise ordered Outcome: Completed/Met Date Met:  10/31/14 Goal: Tolerating diet Outcome: Completed/Met Date Met:  10/31/14

## 2014-10-31 NOTE — Lactation Note (Signed)
This note was copied from the chart of Kelsey Kristie CowmanWinnie Westerhoff. Lactation Consultation Note  Pecola LeisureBaby is now 7232 hours old and not feeding frequently.  Discussed with parents the importance of good feedings or supplementation every 3 hours.  Parents state that baby latched for 30 minutes at last feeding.  Initiated DEBP on preemie setting followed by hand expression for increased calories and stimulation of lactation.  A few drops of colostrum obtained which was given to baby with gloved finger.  Instructed parents to feed with any feeding cue but also waking by every 3rd hour, post pump x 15 minutes followed by hand expression. Discussed that we may need to use small amounts of formula to prevent excessive weight loss/jaundice.  Parents instructed to call out for a feeding assessment with feeding cues.  Patient Name: Kelsey [name] HYQMV'HToday'[name] Date: 10/31/2014 Reason for consult: Follow-up assessment;Infant < 6lbs   Maternal Data    Feeding Feeding Type: Breast Fed Length of feed: 30 min  LATCH Score/Interventions Latch: Grasps breast easily, tongue down, lips flanged, rhythmical sucking.  Audible Swallowing: None Intervention([name]): Skin to skin;Hand expression  Type of Nipple: Everted at rest and after stimulation  Comfort (Breast/Nipple): Filling, red/small blisters or bruises, mild/mod discomfort  Problem noted: Mild/Moderate discomfort Interventions (Mild/moderate discomfort): Hand expression  Hold (Positioning): Assistance needed to correctly position infant at breast and maintain latch. Intervention([name]): Breastfeeding basics reviewed;Support Pillows;Position options;Skin to skin  LATCH Score: 6  Lactation Tools Discussed/Used Pump Review: Setup, frequency, and cleaning;Milk Storage Initiated by:: LMOULDEN RN,IBCLC Date initiated:: 10/31/14   Consult Status Consult Status: Follow-up Date: 11/01/14 Follow-up type: In-patient    Kelsey [name], Kelsey [name] 10/31/2014, 3:39 PM

## 2014-10-31 NOTE — Progress Notes (Signed)
Elevated bps noted  No pain, no vision change, no RUQ pain, no HA. Pt notes finally ready to rest.   Filed Vitals:   10/31/14 1200 10/31/14 1220 10/31/14 1245 10/31/14 1304  BP: 165/85 156/101  131/101  Pulse:   70 65  Temp:      TempSrc:      Resp:      Height:      Weight:        Gen: resting in bed LE: no Edema Abd: no RUQ pain  A/P: PP gest htn vs PP PEC, not severe, asymptomatic - Repeat PIH labs tomorrow, start daily weights and strict I/O, s/p 10mg  IV labetalol and now 200 mg po, plan q 8 hrs, If controlls bp will plan to cont until 3d prior to 6 wk PP visit.  - warning signs reveiwed w/ pt  Halena Mohar A. 10/31/2014 1:42 PM

## 2014-10-31 NOTE — Plan of Care (Signed)
Problem: Phase I Progression Outcomes Goal: VS, stable, temp < 100.4 degrees F Outcome: Completed/Met Date Met:  10/31/14  Problem: Phase II Progression Outcomes Goal: Afebrile, VS remain stable Outcome: Completed/Met Date Met:  10/31/14 Goal: Other Phase II Outcomes/Goals Outcome: Completed/Met Date Met:  10/31/14

## 2014-11-01 LAB — COMPREHENSIVE METABOLIC PANEL
ALT: 19 U/L (ref 0–35)
AST: 38 U/L — ABNORMAL HIGH (ref 0–37)
Albumin: 2.8 g/dL — ABNORMAL LOW (ref 3.5–5.2)
Alkaline Phosphatase: 101 U/L (ref 39–117)
Anion gap: 11 (ref 5–15)
BUN: 17 mg/dL (ref 6–23)
CO2: 23 mEq/L (ref 19–32)
Calcium: 8.8 mg/dL (ref 8.4–10.5)
Chloride: 104 mEq/L (ref 96–112)
Creatinine, Ser: 0.9 mg/dL (ref 0.50–1.10)
GFR calc Af Amer: >90 mL/min (ref >90)
GFR calc non Af Amer: 86 mL/min — ABNORMAL LOW (ref >90)
Glucose, Bld: 82 mg/dL (ref 70–99)
Potassium: 3.9 mEq/L (ref 3.7–5.3)
Sodium: 138 mEq/L (ref 137–147)
Total Bilirubin: 0.2 mg/dL — ABNORMAL LOW (ref 0.3–1.2)
Total Protein: 5.4 g/dL — ABNORMAL LOW (ref 6.0–8.3)

## 2014-11-01 LAB — CBC WITH DIFFERENTIAL/PLATELET
BASOS ABS: 0 10*3/uL (ref 0.0–0.1)
BASOS PCT: 0 % (ref 0–1)
EOS ABS: 0.1 10*3/uL (ref 0.0–0.7)
Eosinophils Relative: 2 % (ref 0–5)
HCT: 24.4 % — ABNORMAL LOW (ref 36.0–46.0)
HEMOGLOBIN: 8.6 g/dL — AB (ref 12.0–15.0)
Lymphocytes Relative: 33 % (ref 12–46)
Lymphs Abs: 2.7 10*3/uL (ref 0.7–4.0)
MCH: 31.9 pg (ref 26.0–34.0)
MCHC: 35.2 g/dL (ref 30.0–36.0)
MCV: 90.4 fL (ref 78.0–100.0)
Monocytes Absolute: 0.4 10*3/uL (ref 0.1–1.0)
Monocytes Relative: 5 % (ref 3–12)
NEUTROS ABS: 4.9 10*3/uL (ref 1.7–7.7)
Neutrophils Relative %: 60 % (ref 43–77)
PLATELETS: 140 10*3/uL — AB (ref 150–400)
RBC: 2.7 MIL/uL — ABNORMAL LOW (ref 3.87–5.11)
RDW: 13.2 % (ref 11.5–15.5)
WBC: 8.1 10*3/uL (ref 4.0–10.5)

## 2014-11-01 MED ORDER — POLYSACCHARIDE IRON COMPLEX 150 MG PO CAPS: 150.0000 mg | ORAL_CAPSULE | Freq: Every day | ORAL | Status: DC

## 2014-11-01 MED ORDER — LABETALOL HCL 200 MG PO TABS: 200.0000 mg | ORAL_TABLET | Freq: Three times a day (TID) | ORAL | Status: DC

## 2014-11-01 MED ORDER — IBUPROFEN 600 MG PO TABS: 600.0000 mg | ORAL_TABLET | Freq: Four times a day (QID) | ORAL | Status: DC

## 2014-11-01 MED ORDER — MAGNESIUM OXIDE 400 (241.3 MG) MG PO TABS
200.0000 mg | ORAL_TABLET | Freq: Every day | ORAL | Status: DC
  Filled 2014-11-01 (×2): qty 0.5

## 2014-11-01 MED ORDER — OXYCODONE-ACETAMINOPHEN 5-325 MG PO TABS: 1.0000 | ORAL_TABLET | ORAL | Status: DC | PRN

## 2014-11-01 MED ORDER — MAGNESIUM OXIDE 400 (241.3 MG) MG PO TABS: 200.0000 mg | ORAL_TABLET | Freq: Every day | ORAL | Status: DC

## 2014-11-01 NOTE — Plan of Care (Signed)
Problem: Discharge Progression Outcomes Goal: Barriers To Progression Addressed/Resolved Outcome: Completed/Met Date Met:  11/01/14     

## 2014-11-01 NOTE — Plan of Care (Signed)
Problem: Discharge Progression Outcomes Goal: Activity appropriate for discharge plan Outcome: Completed/Met Date Met:  11/01/14 Goal: Tolerating diet Outcome: Completed/Met Date Met:  51/88/41 Goal: Complications resolved/controlled Outcome: Completed/Met Date Met:  11/01/14 Goal: Pain controlled with appropriate interventions Outcome: Completed/Met Date Met:  11/01/14 Goal: Afebrile, VS remain stable at discharge Outcome: Completed/Met Date Met:  11/01/14

## 2014-11-01 NOTE — Discharge Summary (Signed)
Obstetric Discharge Summary  Reason for Admission: third trimester vaginal bleeding and contractions Prenatal Procedures: ultrasound Intrapartum Procedures: spontaneous vaginal delivery and episiotomy mediolateral Postpartum Procedures: none Complications-Operative and Postpartum: large blood clot at delivery HEMOGLOBIN  Date Value Ref Range Status  11/01/2014 8.6* 12.0 - 15.0 g/dL Final   HCT  Date Value Ref Range Status  11/01/2014 24.4* 36.0 - 46.0 % Final    Physical Exam:  General: alert, cooperative and no distress Lochia: appropriate Uterine Fundus: firm Incision: healing well DVT Evaluation: No evidence of DVT seen on physical exam.  Discharge Diagnoses: Term Pregnancy-delivered / mild preeclampsia / placental abruption / ABL anemia  Discharge Information: Date: 11/01/2014 Activity: pelvic rest Diet: routine Medications: PNV, Ibuprofen, Iron, Percocet and magnesium Condition: stable Instructions: refer to practice specific booklet Discharge to: home   Follow-up Information    [facility]. Schedule an appointment as soon as possible for a visit in 6 weeks.   Specialty:  Obstetrics and Gynecology   Why:  Call office for appointment EARLY next week for BP check and RX for Labetalol for remainder of PP time   Contact information:   [address] [place] [address] [phone]     [facility] thru [facility]-14) for blood pressure check and additional RX for labetalol  Postpartum visit in 6 weeks - DR Ernestina PennaFogleman  Newborn Data: Live born female  Birth Weight: 5 lb 7.3 oz (2475 g) APGAR: 9, 9  Home with mother.  Marlinda MikeBAILEY, Ayse Mccartin 11/01/2014, 1:39 PM

## 2014-11-01 NOTE — Plan of Care (Signed)
Problem: Discharge Progression Outcomes Goal: Discharge plan in place and appropriate Outcome: Completed/Met Date Met:  11/01/14 Goal: Other Discharge Outcomes/Goals Outcome: Completed/Met Date Met:  11/01/14

## 2014-11-01 NOTE — Progress Notes (Signed)
PPD # 3, SVD, baby girl    S: Reports doing well, and is ready to go home today      Pain well controlled with Ibuprofen      Breast and bottle feeding      Mild headache related to sleep positioning, denies visual disturbances, epigastric pain      Up ad lib/ambulatory/ voiding with no problems      Tolerating POs/ no nausea/ no vomiting       Bleeding as light, no clots       Requests prescription for breast pump to be faxed to company   O: Blood pressure 122/70, pulse 68, temperature 98 F (36.7 C), temperature source Oral, resp. rate 18, height 5\' 4"  (1.626 m), weight 65.137 kg (143 lb 9.6 oz)      Alert, Oriented x 3  Lungs: clear, equal bilaterally  Heart: regular rate, rhythm, S1 and S2 auscultated   Abdomen: soft, non-tender, BS present  Fundus: firm, midline, U-2  Perineum: approximated, no edema, slight swelling, no bruising  Lochia: scant, bright red, no clots noted  Extremities: no edema, no calf pain or tenderness bilaterally  Blood Type: O Pos Tdap: 08/26/14 Flu: 10/12/14  Intake/Output: Net: -320  A: PPD #3, SVD, baby girl      Blood pressures improving on PO Labetalol - hx. Elevated BPs with OCs      ABL Anemia - begin Niferex    P: Discharge home today     WOB discharge instruction booklet given and reviewed     Begin Niferex 150mg  PO BID     Begin Magnesium Oxide 200mg  daily with iron supplement     Continue PO Labetalol    Milinda CaveMeredith Sharanda Shinault, SNM

## 2014-11-01 NOTE — Lactation Note (Signed)
This note was copied from the chart of Kelsey Tracyann Duffell. Lactation Consultation Note: Infant is at 49 weight loss this am. Mother is cue base feeding infant and she has  Supplemented   infant 5 ml of Neosure after 2 feedings. Advised to offer infant an increase in volume until mothers milk volume increases. Guidelines given using parent instruction sheet for less that 6 lb infants. Parents receptive to all teaching. Discussed supply and demand. Reviewed baby and me with with breastmilk collection and storage guidelines. Mother pumping at present. (L) side not pulling. Mother was given another pump kit. Observed that mother is using #24 flanges. Mother to post pump after each feeding for 15-20 mins. Reviewed hand expression and observed good flow of colostrum. Advised mother in prevention and treatment of severe engorgement. Continue to supplement infant using EBM/ Neosure. Discussed 2 week pump rental. Mother to page for next feeding assistance and if she wants a pump rental. Mother was also advised to follow up with [facility] outpatient dept. Mother states she wants visit but wants to phone later. Mother has a follow up with Peds office in am for weight check.   Patient Name: Kelsey [name] [id number]'E Date: 11/01/2014 Reason for consult: Follow-up assessment   Maternal Data    Feeding Feeding Type: Bottle Fed - Formula Nipple Type: Slow - flow Length of feed: 25 min  LATCH Score/Interventions                      Lactation Tools Discussed/Used Tools: Feeding cup;Other (comment) (4 drops) Pump Review: Setup, frequency, and cleaning;Milk Storage Date initiated:: 11/01/14   Consult Status Consult Status: Follow-up Date: 11/01/14 Follow-up type: In-patient    Jess Barters [facility] 11/01/2014, 11:21 AM

## 2014-11-01 NOTE — Lactation Note (Signed)
This note was copied from the chart of Kelsey [name]. Lactation Consultation Note: Mother paged to take home 2 week rental. Mother supplemented with Neosure and gave 15 ml with last feeding using a bottle . Staff nurse taught bottle feeding. Mother did not page for latch assist. She was offered an out patient visit and advised follow up with LC. Mother declines at this time and states she will call as needed. Mother has LC contact information. She states her pump should come in the main within 12 days. Mother to will see Peds in am.  Patient Name: Kelsey Kristie CowmanWinnie Sizemore UJWJX'BToday's Date: 11/01/2014 Reason for consult: Follow-up assessment   Maternal Data    Feeding Feeding Type: Bottle Fed - Formula Nipple Type: Slow - flow Length of feed: 25 min  LATCH Score/Interventions                      Lactation Tools Discussed/Used Tools: Feeding cup;Other (comment) (4 drops) Pump Review: Setup, frequency, and cleaning;Milk Storage Date initiated:: 11/01/14   Consult Status Consult Status: Follow-up Date: 11/01/14 Follow-up type: In-patient    Stevan BornKendrick, Marilynn Ekstein [facility] 11/01/2014, 11:29 AM

## 2014-11-15 ENCOUNTER — Encounter (HOSPITAL_COMMUNITY)
Admission: RE | Admit: 2014-11-15 | Discharge: 2014-11-15 | Disposition: A | Discharge status: Home / Self Care | Payer: BC Managed Care – PPO | Source: Ambulatory Visit | Attending: Obstetrics | Admitting: Obstetrics

## 2014-11-15 DIAGNOSIS — O923 Agalactia: Secondary | ICD-10-CM | POA: Diagnosis present

## 2014-12-15 ENCOUNTER — Encounter (HOSPITAL_COMMUNITY)
Admission: RE | Admit: 2014-12-15 | Discharge: 2014-12-15 | Disposition: A | Discharge status: Home / Self Care | Payer: BC Managed Care – PPO | Source: Ambulatory Visit | Attending: Obstetrics | Admitting: Obstetrics

## 2014-12-15 DIAGNOSIS — O923 Agalactia: Secondary | ICD-10-CM | POA: Diagnosis not present

## 2015-01-15 ENCOUNTER — Encounter (HOSPITAL_COMMUNITY)
Admission: RE | Admit: 2015-01-15 | Discharge: 2015-01-15 | Disposition: A | Discharge status: Home / Self Care | Payer: BC Managed Care – PPO | Source: Ambulatory Visit | Attending: Obstetrics | Admitting: Obstetrics

## 2015-01-15 DIAGNOSIS — O923 Agalactia: Secondary | ICD-10-CM | POA: Insufficient documentation

## 2015-02-15 ENCOUNTER — Encounter (HOSPITAL_COMMUNITY)
Admission: RE | Admit: 2015-02-15 | Discharge: 2015-02-15 | Disposition: A | Discharge status: Home / Self Care | Payer: BC Managed Care – PPO | Source: Ambulatory Visit | Attending: Obstetrics | Admitting: Obstetrics

## 2015-02-15 DIAGNOSIS — O923 Agalactia: Secondary | ICD-10-CM | POA: Diagnosis present

## 2015-03-17 ENCOUNTER — Encounter (HOSPITAL_COMMUNITY)
Admission: RE | Admit: 2015-03-17 | Discharge: 2015-03-17 | Disposition: A | Discharge status: Home / Self Care | Payer: BLUE CROSS/BLUE SHIELD | Source: Ambulatory Visit | Attending: Obstetrics | Admitting: Obstetrics

## 2015-03-17 DIAGNOSIS — O923 Agalactia: Secondary | ICD-10-CM | POA: Insufficient documentation

## 2015-04-17 ENCOUNTER — Encounter (HOSPITAL_COMMUNITY)
Admission: RE | Admit: 2015-04-17 | Discharge: 2015-04-17 | Disposition: A | Discharge status: Home / Self Care | Payer: BLUE CROSS/BLUE SHIELD | Source: Ambulatory Visit | Attending: Obstetrics | Admitting: Obstetrics

## 2015-04-17 DIAGNOSIS — O923 Agalactia: Secondary | ICD-10-CM | POA: Diagnosis present

## 2015-05-17 ENCOUNTER — Encounter (HOSPITAL_COMMUNITY)
Admission: RE | Admit: 2015-05-17 | Discharge: 2015-05-17 | Disposition: A | Discharge status: Home / Self Care | Payer: BLUE CROSS/BLUE SHIELD | Source: Ambulatory Visit | Attending: Obstetrics | Admitting: Obstetrics

## 2015-05-17 DIAGNOSIS — O923 Agalactia: Secondary | ICD-10-CM | POA: Diagnosis not present

## 2015-06-17 ENCOUNTER — Encounter (HOSPITAL_COMMUNITY)
Admission: RE | Admit: 2015-06-17 | Discharge: 2015-06-17 | Disposition: A | Discharge status: Home / Self Care | Payer: BLUE CROSS/BLUE SHIELD | Source: Ambulatory Visit | Attending: Obstetrics | Admitting: Obstetrics

## 2015-06-17 DIAGNOSIS — O923 Agalactia: Secondary | ICD-10-CM | POA: Insufficient documentation

## 2015-07-18 ENCOUNTER — Encounter (HOSPITAL_COMMUNITY)
Admission: RE | Admit: 2015-07-18 | Discharge: 2015-07-18 | Disposition: A | Discharge status: Home / Self Care | Payer: BLUE CROSS/BLUE SHIELD | Source: Ambulatory Visit | Attending: Obstetrics | Admitting: Obstetrics

## 2015-07-18 DIAGNOSIS — O923 Agalactia: Secondary | ICD-10-CM | POA: Insufficient documentation

## 2015-08-18 ENCOUNTER — Encounter (HOSPITAL_COMMUNITY)
Admission: RE | Admit: 2015-08-18 | Discharge: 2015-08-18 | Disposition: A | Discharge status: Home / Self Care | Payer: BLUE CROSS/BLUE SHIELD | Source: Ambulatory Visit | Attending: Obstetrics | Admitting: Obstetrics

## 2015-08-18 DIAGNOSIS — O923 Agalactia: Secondary | ICD-10-CM | POA: Insufficient documentation

## 2015-09-18 ENCOUNTER — Encounter (HOSPITAL_COMMUNITY)
Admission: RE | Admit: 2015-09-18 | Discharge: 2015-09-18 | Disposition: A | Discharge status: Home / Self Care | Payer: BLUE CROSS/BLUE SHIELD | Source: Ambulatory Visit | Attending: Obstetrics | Admitting: Obstetrics

## 2015-09-18 DIAGNOSIS — O923 Agalactia: Secondary | ICD-10-CM | POA: Insufficient documentation

## 2015-12-31 NOTE — L&D Delivery Note (Signed)
Delivery Note At 7:05 AM a healthy female was delivered via Vaginal, Spontaneous Delivery (Presentation: occiput ant ).  APGAR: 8, 9; weight pending .   Placenta status: complete, 3 vs .  Cord:  with the following complications: none.  Cord pH: N/A  Anesthesia:  Epidural Episiotomy: None Lacerations: 2nd degree Suture Repair: 2.0 3.0 vicryl rapide Est. Blood Loss (mL):  300  Mom to postpartum.  Baby to Couplet care / Skin to Skin.  Ryan Ogborn,MARIE-LYNE 10/18/2016, 7:48 AM

## 2016-04-01 LAB — OB RESULTS CONSOLE RUBELLA ANTIBODY, IGM: RUBELLA: IMMUNE

## 2016-04-01 LAB — OB RESULTS CONSOLE ANTIBODY SCREEN: Antibody Screen: NEGATIVE

## 2016-04-01 LAB — OB RESULTS CONSOLE HEPATITIS B SURFACE ANTIGEN: HEP B S AG: NEGATIVE

## 2016-04-12 LAB — OB RESULTS CONSOLE ABO/RH: RH Type: POSITIVE

## 2016-04-12 LAB — OB RESULTS CONSOLE GC/CHLAMYDIA
CHLAMYDIA, DNA PROBE: NEGATIVE
Gonorrhea: NEGATIVE

## 2016-04-12 LAB — OB RESULTS CONSOLE GBS: GBS: POSITIVE

## 2016-04-12 LAB — OB RESULTS CONSOLE RPR: RPR: NONREACTIVE

## 2016-04-12 LAB — OB RESULTS CONSOLE HIV ANTIBODY (ROUTINE TESTING): HIV: NONREACTIVE

## 2016-10-17 ENCOUNTER — Inpatient Hospital Stay (HOSPITAL_COMMUNITY)
Admission: AD | Admit: 2016-10-17 | Discharge: 2016-10-21 | DRG: 775 | Disposition: A | Discharge status: Home / Self Care | Payer: BC Managed Care – PPO | Source: Ambulatory Visit | Attending: Obstetrics & Gynecology | Admitting: Obstetrics & Gynecology

## 2016-10-17 ENCOUNTER — Inpatient Hospital Stay (HOSPITAL_COMMUNITY): Payer: BC Managed Care – PPO | Admitting: Anesthesiology

## 2016-10-17 ENCOUNTER — Encounter (HOSPITAL_COMMUNITY): Payer: Self-pay | Admitting: *Deleted

## 2016-10-17 DIAGNOSIS — O99019 Anemia complicating pregnancy, unspecified trimester: Secondary | ICD-10-CM

## 2016-10-17 DIAGNOSIS — Z3A39 39 weeks gestation of pregnancy: Secondary | ICD-10-CM | POA: Diagnosis not present

## 2016-10-17 DIAGNOSIS — Z8249 Family history of ischemic heart disease and other diseases of the circulatory system: Secondary | ICD-10-CM

## 2016-10-17 DIAGNOSIS — O99824 Streptococcus B carrier state complicating childbirth: Secondary | ICD-10-CM | POA: Diagnosis present

## 2016-10-17 DIAGNOSIS — O9081 Anemia of the puerperium: Secondary | ICD-10-CM | POA: Diagnosis not present

## 2016-10-17 DIAGNOSIS — D62 Acute posthemorrhagic anemia: Secondary | ICD-10-CM | POA: Diagnosis not present

## 2016-10-17 DIAGNOSIS — D509 Iron deficiency anemia, unspecified: Secondary | ICD-10-CM | POA: Diagnosis present

## 2016-10-17 DIAGNOSIS — Z833 Family history of diabetes mellitus: Secondary | ICD-10-CM

## 2016-10-17 DIAGNOSIS — O134 Gestational [pregnancy-induced] hypertension without significant proteinuria, complicating childbirth: Secondary | ICD-10-CM | POA: Diagnosis present

## 2016-10-17 DIAGNOSIS — O169 Unspecified maternal hypertension, unspecified trimester: Secondary | ICD-10-CM | POA: Diagnosis present

## 2016-10-17 HISTORY — DX: Anemia complicating pregnancy, unspecified trimester: O99.019

## 2016-10-17 HISTORY — DX: Acute posthemorrhagic anemia: D62

## 2016-10-17 HISTORY — DX: Iron deficiency anemia, unspecified: D50.9

## 2016-10-17 LAB — TYPE AND SCREEN
ABO/RH(D): O POS
Antibody Screen: NEGATIVE

## 2016-10-17 LAB — COMPREHENSIVE METABOLIC PANEL
ALBUMIN: 3.6 g/dL (ref 3.5–5.0)
ALK PHOS: 166 U/L — AB (ref 38–126)
ALT: 17 U/L (ref 14–54)
ANION GAP: 7 (ref 5–15)
AST: 30 U/L (ref 15–41)
BILIRUBIN TOTAL: 0.5 mg/dL (ref 0.3–1.2)
BUN: 11 mg/dL (ref 6–20)
CO2: 23 mmol/L (ref 22–32)
Calcium: 9.3 mg/dL (ref 8.9–10.3)
Chloride: 106 mmol/L (ref 101–111)
Creatinine, Ser: 0.69 mg/dL (ref 0.44–1.00)
GFR calc Af Amer: >60 mL/min (ref >60)
GLUCOSE: 73 mg/dL (ref 65–99)
Potassium: 4 mmol/L (ref 3.5–5.1)
Sodium: 136 mmol/L (ref 135–145)
TOTAL PROTEIN: 6.7 g/dL (ref 6.5–8.1)

## 2016-10-17 LAB — PROTEIN / CREATININE RATIO, URINE
CREATININE, URINE: 30 mg/dL
Total Protein, Urine: <6 mg/dL

## 2016-10-17 LAB — CBC
HCT: 35 % — ABNORMAL LOW (ref 36.0–46.0)
HEMOGLOBIN: 12.4 g/dL (ref 12.0–15.0)
MCH: 31 pg (ref 26.0–34.0)
MCHC: 35.4 g/dL (ref 30.0–36.0)
MCV: 87.5 fL (ref 78.0–100.0)
PLATELETS: 183 10*3/uL (ref 150–400)
RBC: 4 MIL/uL (ref 3.87–5.11)
RDW: 13.2 % (ref 11.5–15.5)
WBC: 6.9 10*3/uL (ref 4.0–10.5)

## 2016-10-17 LAB — URIC ACID: Uric Acid, Serum: 6 mg/dL (ref 2.3–6.6)

## 2016-10-17 LAB — LACTATE DEHYDROGENASE: LDH: 161 U/L (ref 98–192)

## 2016-10-17 MED ORDER — LACTATED RINGERS IV SOLN
500.0000 mL | Freq: Once | INTRAVENOUS | Status: DC
  Administered 2016-10-18: 1000 mL via INTRAVENOUS

## 2016-10-17 MED ORDER — EPHEDRINE 5 MG/ML INJ: 10.0000 mg | INTRAVENOUS | Status: DC | PRN

## 2016-10-17 MED ORDER — DIPHENHYDRAMINE HCL 50 MG/ML IJ SOLN: 12.5000 mg | INTRAMUSCULAR | Status: DC | PRN

## 2016-10-17 MED ORDER — PENICILLIN G POTASSIUM 5000000 UNITS IJ SOLR
2.5000 10*6.[IU] | INTRAVENOUS | Status: DC
  Administered 2016-10-17 – 2016-10-18 (×2): 2.5 10*6.[IU] via INTRAVENOUS
  Filled 2016-10-17 (×4): qty 2.5

## 2016-10-17 MED ORDER — PHENYLEPHRINE 40 MCG/ML (10ML) SYRINGE FOR IV PUSH (FOR BLOOD PRESSURE SUPPORT): 80.0000 ug | PREFILLED_SYRINGE | INTRAVENOUS | Status: DC | PRN

## 2016-10-17 MED ORDER — PHENYLEPHRINE 40 MCG/ML (10ML) SYRINGE FOR IV PUSH (FOR BLOOD PRESSURE SUPPORT)
80.0000 ug | PREFILLED_SYRINGE | INTRAVENOUS | Status: DC | PRN
  Filled 2016-10-17: qty 10

## 2016-10-17 MED ORDER — OXYTOCIN 40 UNITS IN LACTATED RINGERS INFUSION - SIMPLE MED
2.5000 [IU]/h | INTRAVENOUS | Status: DC
  Administered 2016-10-18: 39.96 [IU]/h via INTRAVENOUS

## 2016-10-17 MED ORDER — LIDOCAINE HCL (PF) 1 % IJ SOLN
INTRAMUSCULAR | Status: DC | PRN
  Administered 2016-10-17: 5 mL via EPIDURAL

## 2016-10-17 MED ORDER — OXYTOCIN BOLUS FROM INFUSION
500.0000 mL | Freq: Once | INTRAVENOUS | Status: AC
  Administered 2016-10-18: 500 mL via INTRAVENOUS

## 2016-10-17 MED ORDER — FLEET ENEMA 7-19 GM/118ML RE ENEM: 1.0000 | ENEMA | Freq: Every day | RECTAL | Status: DC | PRN

## 2016-10-17 MED ORDER — OXYTOCIN 40 UNITS IN LACTATED RINGERS INFUSION - SIMPLE MED
1.0000 m[IU]/min | INTRAVENOUS | Status: DC
  Administered 2016-10-17: 2 m[IU]/min via INTRAVENOUS
  Filled 2016-10-17: qty 1000

## 2016-10-17 MED ORDER — PENICILLIN G POTASSIUM 5000000 UNITS IJ SOLR
5.0000 10*6.[IU] | Freq: Once | INTRAMUSCULAR | Status: AC
  Administered 2016-10-17: 5 10*6.[IU] via INTRAVENOUS
  Filled 2016-10-17: qty 5

## 2016-10-17 MED ORDER — OXYCODONE-ACETAMINOPHEN 5-325 MG PO TABS: 1.0000 | ORAL_TABLET | ORAL | Status: DC | PRN

## 2016-10-17 MED ORDER — OXYCODONE-ACETAMINOPHEN 5-325 MG PO TABS: 2.0000 | ORAL_TABLET | ORAL | Status: DC | PRN

## 2016-10-17 MED ORDER — SOD CITRATE-CITRIC ACID 500-334 MG/5ML PO SOLN: 30.0000 mL | ORAL | Status: DC | PRN

## 2016-10-17 MED ORDER — FENTANYL 2.5 MCG/ML BUPIVACAINE 1/10 % EPIDURAL INFUSION (WH - ANES)
14.0000 mL/h | INTRAMUSCULAR | Status: DC | PRN
  Administered 2016-10-17 – 2016-10-18 (×2): 14 mL/h via EPIDURAL
  Filled 2016-10-17 (×2): qty 125

## 2016-10-17 MED ORDER — ACETAMINOPHEN 325 MG PO TABS: 650.0000 mg | ORAL_TABLET | ORAL | Status: DC | PRN

## 2016-10-17 MED ORDER — LACTATED RINGERS IV SOLN
INTRAVENOUS | Status: DC
  Administered 2016-10-17 (×2): via INTRAVENOUS

## 2016-10-17 MED ORDER — TERBUTALINE SULFATE 1 MG/ML IJ SOLN: 0.2500 mg | Freq: Once | INTRAMUSCULAR | Status: DC | PRN

## 2016-10-17 MED ORDER — LIDOCAINE HCL (PF) 1 % IJ SOLN
30.0000 mL | INTRAMUSCULAR | Status: DC | PRN
  Filled 2016-10-17: qty 30

## 2016-10-17 MED ORDER — ONDANSETRON HCL 4 MG/2ML IJ SOLN: 4.0000 mg | Freq: Four times a day (QID) | INTRAMUSCULAR | Status: DC | PRN

## 2016-10-17 MED ORDER — LACTATED RINGERS IV SOLN
500.0000 mL | INTRAVENOUS | Status: DC | PRN
  Administered 2016-10-17: 500 mL via INTRAVENOUS

## 2016-10-17 MED ORDER — LACTATED RINGERS IV SOLN: 500.0000 mL | Freq: Once | INTRAVENOUS | Status: DC

## 2016-10-17 NOTE — Anesthesia Preprocedure Evaluation (Signed)
Anesthesia Evaluation  Patient identified by MRN, date of birth, ID band Patient awake    Reviewed: Allergy & Precautions, Patient's Chart, lab work & pertinent test results  Airway Mallampati: I  TM Distance: >3 FB Neck ROM: Full    Dental  (+) Teeth Intact, Dental Advisory Given   Pulmonary neg pulmonary ROS,    breath sounds clear to auscultation       Cardiovascular  Rhythm:Regular Rate:Normal     Neuro/Psych negative neurological ROS  negative psych ROS   GI/Hepatic negative GI ROS, Neg liver ROS,   Endo/Other  negative endocrine ROS  Renal/GU negative Renal ROS  negative genitourinary   Musculoskeletal negative musculoskeletal ROS (+)   Abdominal   Peds negative pediatric ROS (+)  Hematology negative hematology ROS (+)   Anesthesia Other Findings   Reproductive/Obstetrics negative OB ROS                             Lab Results  Component Value Date   WBC 6.9 10/17/2016   HGB 12.4 10/17/2016   HCT 35.0 (L) 10/17/2016   MCV 87.5 10/17/2016   PLT 183 10/17/2016   Lab Results  Component Value Date   INR 1.01 10/30/2014   INR 1.02 10/29/2014     Anesthesia Physical Anesthesia Plan  ASA: II  Anesthesia Plan: Epidural   Post-op Pain Management:    Induction:   Airway Management Planned:   Additional Equipment:   Intra-op Plan:   Post-operative Plan:   Informed Consent: I have reviewed the patients History and Physical, chart, labs and discussed the procedure including the risks, benefits and alternatives for the proposed anesthesia with the patient or authorized representative who has indicated his/her understanding and acceptance.     Plan Discussed with:   Anesthesia Plan Comments:         Anesthesia Quick Evaluation

## 2016-10-17 NOTE — Anesthesia Procedure Notes (Signed)
Epidural Patient location during procedure: OB Start time: 10/17/2016 10:14 PM End time: 10/17/2016 10:23 PM  Staffing Anesthesiologist: Shona SimpsonHOLLIS, Kelsey Lizardo D Performed: anesthesiologist   Preanesthetic Checklist Completed: patient identified, site marked, surgical consent, pre-op evaluation, timeout performed, IV checked, risks and benefits discussed and monitors and equipment checked  Epidural Patient position: sitting Prep: ChloraPrep Patient monitoring: heart rate, continuous pulse ox and blood pressure Approach: midline Location: L3-L4 Injection technique: LOR saline  Needle:  Needle type: Tuohy  Needle gauge: 17 G Needle length: 9 cm Catheter type: closed end flexible Catheter size: 20 Guage Test dose: negative and 1.5% lidocaine  Assessment Events: blood not aspirated, injection not painful, no injection resistance and no paresthesia  Additional Notes LOR @ 4.5  Patient identified. Risks/Benefits/Options discussed with patient including but not limited to bleeding, infection, nerve damage, paralysis, failed block, incomplete pain control, headache, blood pressure changes, nausea, vomiting, reactions to medications, itching and postpartum back pain. Confirmed with bedside nurse the patient's most recent platelet count. Confirmed with patient that they are not currently taking any anticoagulation, have any bleeding history or any family history of bleeding disorders. Patient expressed understanding and wished to proceed. All questions were answered. Sterile technique was used throughout the entire procedure. Please see nursing notes for vital signs. Test dose was given through epidural catheter and negative prior to continuing to dose epidural or start infusion. Warning signs of high block given to the patient including shortness of breath, tingling/numbness in hands, complete motor block, or any concerning symptoms with instructions to call for help. Patient was given instructions  on fall risk and not to get out of bed. All questions and concerns addressed with instructions to call with any issues or inadequate analgesia.    Reason for block:procedure for pain

## 2016-10-18 ENCOUNTER — Encounter (HOSPITAL_COMMUNITY): Payer: Self-pay | Admitting: *Deleted

## 2016-10-18 LAB — CBC
HEMATOCRIT: 31 % — AB (ref 36.0–46.0)
Hemoglobin: 10.8 g/dL — ABNORMAL LOW (ref 12.0–15.0)
MCH: 30.5 pg (ref 26.0–34.0)
MCHC: 34.8 g/dL (ref 30.0–36.0)
MCV: 87.6 fL (ref 78.0–100.0)
PLATELETS: 172 10*3/uL (ref 150–400)
RBC: 3.54 MIL/uL — AB (ref 3.87–5.11)
RDW: 13.3 % (ref 11.5–15.5)
WBC: 14.7 10*3/uL — AB (ref 4.0–10.5)

## 2016-10-18 LAB — RPR: RPR: NONREACTIVE

## 2016-10-18 MED ORDER — ZOLPIDEM TARTRATE 5 MG PO TABS: 5.0000 mg | ORAL_TABLET | Freq: Every evening | ORAL | Status: DC | PRN

## 2016-10-18 MED ORDER — TETANUS-DIPHTH-ACELL PERTUSSIS 5-2.5-18.5 LF-MCG/0.5 IM SUSP: 0.5000 mL | Freq: Once | INTRAMUSCULAR | Status: DC

## 2016-10-18 MED ORDER — IBUPROFEN 600 MG PO TABS
600.0000 mg | ORAL_TABLET | Freq: Four times a day (QID) | ORAL | Status: DC
  Administered 2016-10-18 – 2016-10-21 (×12): 600 mg via ORAL
  Filled 2016-10-18 (×13): qty 1

## 2016-10-18 MED ORDER — BENZOCAINE-MENTHOL 20-0.5 % EX AERO
1.0000 "application " | INHALATION_SPRAY | CUTANEOUS | Status: DC | PRN
  Administered 2016-10-18: 1 via TOPICAL
  Filled 2016-10-18 (×2): qty 56

## 2016-10-18 MED ORDER — SIMETHICONE 80 MG PO CHEW: 80.0000 mg | CHEWABLE_TABLET | ORAL | Status: DC | PRN

## 2016-10-18 MED ORDER — CARBOPROST TROMETHAMINE 250 MCG/ML IM SOLN
INTRAMUSCULAR | Status: AC
  Filled 2016-10-18: qty 1

## 2016-10-18 MED ORDER — ONDANSETRON HCL 4 MG PO TABS: 4.0000 mg | ORAL_TABLET | ORAL | Status: DC | PRN

## 2016-10-18 MED ORDER — PRENATAL MULTIVITAMIN CH
1.0000 | ORAL_TABLET | Freq: Every day | ORAL | Status: DC
  Administered 2016-10-18 – 2016-10-21 (×4): 1 via ORAL
  Filled 2016-10-18 (×4): qty 1

## 2016-10-18 MED ORDER — DIPHENHYDRAMINE HCL 25 MG PO CAPS: 25.0000 mg | ORAL_CAPSULE | Freq: Four times a day (QID) | ORAL | Status: DC | PRN

## 2016-10-18 MED ORDER — IBUPROFEN 600 MG PO TABS
600.0000 mg | ORAL_TABLET | Freq: Four times a day (QID) | ORAL | Status: DC | PRN
Start: 2016-10-18 — End: 2016-10-18

## 2016-10-18 MED ORDER — ONDANSETRON HCL 4 MG/2ML IJ SOLN: 4.0000 mg | INTRAMUSCULAR | Status: DC | PRN

## 2016-10-18 MED ORDER — DIBUCAINE 1 % RE OINT
1.0000 "application " | TOPICAL_OINTMENT | RECTAL | Status: DC | PRN
  Filled 2016-10-18: qty 56.7

## 2016-10-18 MED ORDER — SENNOSIDES-DOCUSATE SODIUM 8.6-50 MG PO TABS
2.0000 | ORAL_TABLET | ORAL | Status: DC
  Administered 2016-10-19 – 2016-10-20 (×3): 2 via ORAL
  Filled 2016-10-18 (×3): qty 2

## 2016-10-18 MED ORDER — WITCH HAZEL-GLYCERIN EX PADS
1.0000 | MEDICATED_PAD | CUTANEOUS | Status: DC | PRN
Start: 2016-10-18 — End: 2016-10-21

## 2016-10-18 MED ORDER — DIPHENOXYLATE-ATROPINE 2.5-0.025 MG PO TABS
2.0000 | ORAL_TABLET | Freq: Four times a day (QID) | ORAL | Status: DC | PRN
  Administered 2016-10-18: 2 via ORAL
  Filled 2016-10-18: qty 2

## 2016-10-18 MED ORDER — OXYTOCIN 40 UNITS IN LACTATED RINGERS INFUSION - SIMPLE MED: 2.5000 [IU]/h | INTRAVENOUS | Status: DC | PRN

## 2016-10-18 MED ORDER — OXYTOCIN 40 UNITS IN LACTATED RINGERS INFUSION - SIMPLE MED
2.5000 [IU]/h | INTRAVENOUS | Status: DC | PRN
  Administered 2016-10-18: 2.5 [IU]/h via INTRAVENOUS
  Filled 2016-10-18: qty 1000

## 2016-10-18 MED ORDER — ACETAMINOPHEN 325 MG PO TABS: 650.0000 mg | ORAL_TABLET | ORAL | Status: DC | PRN

## 2016-10-18 MED ORDER — COCONUT OIL OIL
1.0000 "application " | TOPICAL_OIL | Status: DC | PRN
  Filled 2016-10-18 (×2): qty 120

## 2016-10-18 MED ORDER — CARBOPROST TROMETHAMINE 250 MCG/ML IM SOLN
250.0000 ug | Freq: Once | INTRAMUSCULAR | Status: AC
  Administered 2016-10-18: 250 ug via INTRAMUSCULAR

## 2016-10-18 NOTE — Progress Notes (Addendum)
Updated Dr Seymour BarsLavoie on current CBC results, fundal exams and bleeding.  May D/C to postpartum per Dr Seymour BarsLavoie.  Infuse #2 bag of pitocin 40units/1000cc LR per order.  Dr Arby BarretteHatchett from Anesthesia notified of current CBC and status.  May remove epidural catheter.

## 2016-10-18 NOTE — Progress Notes (Signed)
Medicated with hemabate IM

## 2016-10-18 NOTE — Progress Notes (Addendum)
Pitocin drip rate increased.

## 2016-10-18 NOTE — Anesthesia Rounding Note (Signed)
  CRNA Epidural Rounding Note  Patient: Lora PaulaWinnie Yu-Ting Ghazarian, 30 y.o., female  Patient's current pain level: 3  Agreed upon pain management level: 4  Epidural intervention: No  Comments: Epidural placed 0300, patient calm, pain residing  [facility] Hospital[facility]IGHT,Haidyn Chadderdon 10/18/2016

## 2016-10-18 NOTE — Anesthesia Postprocedure Evaluation (Signed)
Anesthesia Post Note  Patient: Lora PaulaWinnie Yu-Ting Houchins  Procedure(s) Performed: * No procedures listed *  Patient location during evaluation: Mother Baby Anesthesia Type: Epidural Level of consciousness: awake and alert Pain management: pain level controlled Vital Signs Assessment: post-procedure vital signs reviewed and stable Respiratory status: spontaneous breathing and nonlabored ventilation Cardiovascular status: stable Postop Assessment: no headache, no backache, patient able to bend at knees, epidural receding, no signs of nausea or vomiting and adequate PO intake Anesthetic complications: no     Last Vitals:  Vitals:   10/18/16 1125 10/18/16 1213  BP: (!) 135/93 (!) 148/75  Pulse: 70 67  Resp: 20 18  Temp: 36.4 C 36.7 C    Last Pain:  Vitals:   10/18/16 1213  TempSrc: Oral  PainSc:    Pain Goal:                 Land O'LakesMalinova,Terika Pillard Hristova

## 2016-10-18 NOTE — Progress Notes (Signed)
Diarrhea on exam.

## 2016-10-18 NOTE — H&P (Signed)
Kelsey [name] is a 30 y.o. female G2P1001 7960w1d [facility]  HPP/HPI:  Normal pregnancy up to today's visit when her BPs were elevated.  No PEC Sx.  Prtn Neg.  Good FMs.  No vaginal bleeding.  VE 4 cm/60% in office.  H/O Abruptio with PIH with precipitous Vaginal delivery in G1.  Thrombophilic work-up only MTHFR HTZ,  Folic Acid 4 mg qd.  BB ASA.  OB History    Gravida Para Term Preterm AB Living   2 1 1     1    SAB TAB Ectopic Multiple Live Births           1     Past Medical History:  Diagnosis Date  . Medical history non-contributory    Past Surgical History:  Procedure Laterality Date  . HERNIA REPAIR     Family History: family history includes Cancer in her father; Diabetes in her mother; Hypertension in her father. Social History:  reports that she has never smoked. She has never used smokeless tobacco. She reports that she does not drink alcohol or use drugs.  No Known Allergies  Dilation: Lip/rim Effacement (%): 80 Station: +2 Exam by:: Dr. Seymour BarsLavoie   AROM around 10 pm AF clear.  Blood pressure 129/90, pulse (!) 114, temperature 97.5 F (36.4 C), temperature source Oral, resp. rate 17, height 5\' 4"  (1.626 m), weight 164 lb (74.4 kg), SpO2 99 %, unknown if currently breastfeeding.   Exam Physical Exam   FHR 130's with good accelerations and variability, no deceleration. UCs q3 min after AROM.  HPP:  Patient Active Problem List   Diagnosis Date Noted  . Hypertension affecting pregnancy 10/17/2016  . Placental abruption in third trimester 10/30/2014  . Postpartum care following vaginal delivery (11/1) 10/30/2014  . Antepartum bleeding 10/29/2014    Prenatal labs: ABO, Rh: --/--/O POS (10/19 1840) Antibody: NEG (10/19 1840) Rubella: Immune RPR: Non Reactive (10/19 1840)  HBsAg:  NR  HIV: Non-reactive (04/14 0000)  Genetic testing: Ultrascreen wnl, AFP1 neg. US anato: wnl, female 1 hr GTT: wnl at 80 GBS: Positive (04/14  0000)   CBC with Normal Plts at 180.  Assessment/Plan: G2P1 39+ wks with PIH for Induction.  No evidence of PEC.  AROM AF clear.  FHR Cat 1.  Pitocin PRN.  Epidural.  GBS pos Pen G protocole. Expectant management towards vaginal delivery.   Sana Tessmer,MARIE-LYNE 10/18/2016, 4:19 AM

## 2016-10-18 NOTE — Lactation Note (Signed)
This note was copied from a baby's chart. Lactation Consultation Note  Patient Name: Kelsey [name] [id number]'XToday's Date: 10/18/2016 Reason for consult: Initial assessment   [facility] with [facility] Infant asleep STS on mom's chest. Mom and dad report infant fed for 45 minutes after delivery. Mom is concerned she does not have enough milk. Showed mom how to hand express and colostrum was noted, mom was pleased. Mom with firm compressible breasts and areola with everted nipples.She was able to return Hand expression demo. Enc her to hand express prior to each latch and after feeds to apply EBM to nipples.    Enc mom to feed 8-12 x in 24 hours at first feeding cues. Discussed supply and demand, hand expression, Colostrum, milk coming to volume, NB nutritional needs, I/O and maintaining feeding log.   BF Resources Handout and LC Brochure given, mom informed of IP/OP Services, BF Support Groups and LC phone #. Enc mom to call out to desk if feeding assistance is needed. Follow up tomorrow and prn.    Maternal Data Formula Feeding for Exclusion: Yes Reason for exclusion: Mother's choice to formula and breast feed on admission Has patient been taught Hand Expression?: Yes Does the patient have breastfeeding experience prior to this delivery?: Yes  Feeding Feeding Type: Breast Fed Length of feed: 30 min  LATCH Score/Interventions Latch: Grasps breast easily, tongue down, lips flanged, rhythmical sucking. Intervention(s): Assist with latch;Adjust position  Audible Swallowing: A few with stimulation Intervention(s): Skin to skin  Type of Nipple: Everted at rest and after stimulation  Comfort (Breast/Nipple): Soft / non-tender     Hold (Positioning): No assistance needed to correctly position infant at breast. Intervention(s): Support Pillows;Position options;Skin to skin  LATCH Score: 9  Lactation Tools Discussed/Used WIC Program:  No   Consult Status Consult Status: Follow-up Date: 10/19/16 Follow-up type: In-patient    Silas FloodSharon S Hice 10/18/2016, 10:30 AM

## 2016-10-18 NOTE — Progress Notes (Signed)
Lab at bedside for blood draw.

## 2016-10-19 LAB — CBC
HEMATOCRIT: 21.4 % — AB (ref 36.0–46.0)
HEMOGLOBIN: 7.7 g/dL — AB (ref 12.0–15.0)
MCH: 31.3 pg (ref 26.0–34.0)
MCHC: 36 g/dL (ref 30.0–36.0)
MCV: 87 fL (ref 78.0–100.0)
Platelets: 131 10*3/uL — ABNORMAL LOW (ref 150–400)
RBC: 2.46 MIL/uL — ABNORMAL LOW (ref 3.87–5.11)
RDW: 13.4 % (ref 11.5–15.5)
WBC: 9.6 10*3/uL (ref 4.0–10.5)

## 2016-10-19 MED ORDER — POLYSACCHARIDE IRON COMPLEX 150 MG PO CAPS
150.0000 mg | ORAL_CAPSULE | Freq: Every day | ORAL | Status: DC
  Administered 2016-10-20 – 2016-10-21 (×2): 150 mg via ORAL
  Filled 2016-10-19 (×2): qty 1

## 2016-10-19 MED ORDER — MAGNESIUM OXIDE 400 (241.3 MG) MG PO TABS
400.0000 mg | ORAL_TABLET | Freq: Every day | ORAL | Status: DC
  Administered 2016-10-19: 400 mg via ORAL
  Filled 2016-10-19 (×2): qty 1

## 2016-10-19 NOTE — Progress Notes (Signed)
PPD 1 SVD  S:  Reports feeling ok             Tolerating po/ No nausea or vomiting             Bleeding is light             Pain controlled with motrin             Up ad lib / ambulatory / voiding QS  Newborn breast & formula feeding   O:               VS: BP (!) 144/83   Pulse 78   Temp 97.9 F (36.6 C) (Oral)   Resp 18   Ht 5\' 4"  (1.626 m)   Wt 74.4 kg (164 lb)   SpO2 99%   Breastfeeding? Unknown   BMI 28.15 kg/m    LABS:              Recent Labs  10/18/16 0948 10/19/16 0519  WBC 14.7* 9.6  HGB 10.8* 7.7*  PLT 172 131*               Blood type: --/--/O POS (10/19 1840)  Rubella: Immune (04/03 0000)                     I&O: Intake/Output      10/20 0701 - 10/21 0700 10/21 0701 - 10/22 0700   Urine (mL/kg/hr) 700 (0.4)    Blood 1252 (0.7)    Total Output 1952     Net -1952          Urine Occurrence 1 x                 Physical Exam:             Alert and oriented X3  Lungs: Clear and unlabored  Heart: regular rate and rhythm / no mumurs  Abdomen: soft, non-tender, non-distended              Fundus: firm, non-tender, U-1  Perineum: mild edema  Lochia: light  Extremities: no edema, no calf pain or tenderness    A: PPD # 1              ABL anemia - stable  Doing well - stable status  P: Routine post partum orders             Start iron    Marlinda MikeBAILEY, Destiney Sanabia CNM, MSN, FACNM 10/19/2016, 12:12 PM

## 2016-10-20 LAB — COMPREHENSIVE METABOLIC PANEL
ALT: 23 U/L (ref 14–54)
AST: 44 U/L — ABNORMAL HIGH (ref 15–41)
Albumin: 3 g/dL — ABNORMAL LOW (ref 3.5–5.0)
Alkaline Phosphatase: 102 U/L (ref 38–126)
Anion gap: 6 (ref 5–15)
BUN: 12 mg/dL (ref 6–20)
CO2: 27 mmol/L (ref 22–32)
Calcium: 9.3 mg/dL (ref 8.9–10.3)
Chloride: 106 mmol/L (ref 101–111)
Creatinine, Ser: 0.68 mg/dL (ref 0.44–1.00)
GFR calc Af Amer: >60 mL/min (ref >60)
GFR calc non Af Amer: >60 mL/min (ref >60)
Glucose, Bld: 87 mg/dL (ref 65–99)
Potassium: 4.1 mmol/L (ref 3.5–5.1)
Sodium: 139 mmol/L (ref 135–145)
Total Bilirubin: 0.3 mg/dL (ref 0.3–1.2)
Total Protein: 5.7 g/dL — ABNORMAL LOW (ref 6.5–8.1)

## 2016-10-20 LAB — CBC
HCT: 23.9 % — ABNORMAL LOW (ref 36.0–46.0)
Hemoglobin: 8.5 g/dL — ABNORMAL LOW (ref 12.0–15.0)
MCH: 31.5 pg (ref 26.0–34.0)
MCHC: 35.6 g/dL (ref 30.0–36.0)
MCV: 88.5 fL (ref 78.0–100.0)
Platelets: 176 10*3/uL (ref 150–400)
RBC: 2.7 MIL/uL — ABNORMAL LOW (ref 3.87–5.11)
RDW: 13.9 % (ref 11.5–15.5)
WBC: 7.3 10*3/uL (ref 4.0–10.5)

## 2016-10-20 MED ORDER — MAGNESIUM 200 MG PO TABS
200.0000 mg | ORAL_TABLET | Freq: Every day | ORAL | Status: DC
  Administered 2016-10-20 – 2016-10-21 (×2): 200 mg via ORAL
  Filled 2016-10-20 (×2): qty 1

## 2016-10-20 NOTE — Progress Notes (Signed)
Paged MD 2nd attempt regarding pt's BP

## 2016-10-20 NOTE — Progress Notes (Signed)
Dr Billy Coastaavon paged for third time to inquire about parameters or further orders to be carried out regarding patient's elevated Bp.   Dr. Cherly Hensenousins also paged at this time to inquire about parameters/orders for patient's Bp.   During nursing rounds, patient is asymptmatic. Denies any headache, dizziness, tingling. Patient states she had this happen to her with her last baby and was sent home on temporary BP medication.

## 2016-10-20 NOTE — Progress Notes (Signed)
   10/20/16 1724  Vital Signs  BP (!) 149/98  BP Location Left Arm  Patient Position (if appropriate) Sitting  BP Method Automatic  Pulse Rate 75  Pulse Rate Source Dinamap  Resp 18  Temp 98 F (36.7 C)  Temp Source Oral  MD paged

## 2016-10-20 NOTE — Progress Notes (Signed)
Dr Cherly Hensenousins calls - paramters for BP - call if diastolic pressure is >105.

## 2016-10-20 NOTE — Progress Notes (Signed)
Dr. Cherly Hensenousins paged a second time.

## 2016-10-20 NOTE — Progress Notes (Signed)
PPD 2 SVD  S:  Reports feeling fine             Tolerating po/ No nausea or vomiting             Bleeding is light             Pain controlled with motrin             Up ad lib / ambulatory / voiding QS  Newborn breast feeding  O:               VS: BP (!) 154/94 (BP Location: Left Arm)   Pulse 65   Temp 97.7 F (36.5 C) (Axillary)   Resp 18   Ht 5\' 4"  (1.626 m)   Wt 74.4 kg (164 lb)   SpO2 97%   Breastfeeding? Unknown   BMI 28.15 kg/m    BP 154/94 - 135/84 - 144/83   LABS:              Recent Labs  10/18/16 0948 10/19/16 0519  WBC 14.7* 9.6  HGB 10.8* 7.7*  PLT 172 131*               Blood type: --/--/O POS (10/19 1840)  Rubella: Immune (04/03 0000)                    Physical Exam:             Alert and oriented X3  Abdomen: soft, non-tender, non-distended              Fundus: firm, non-tender, U-1  Perineum: ice pack in place  Lochia: light  Extremities: 1+ pedal edema, no calf pain or tenderness    A: PPD # 2             MTFHR heterozygous / hx PEC previous pregnancy   ABL anemia  P: Routine post partum orders  Check PIH labs & monitor BP Q 4 today                  determine Dc plan this PM  Marlinda MikeBAILEY, Giavonna Pflum CNM, MSN, [facility]icsFACNM 10/20/2016, 12:13 PM   Addendum - labile hypertension with mild elevation in LE - recheck labs in AM / monitor BP closely

## 2016-10-21 ENCOUNTER — Encounter (HOSPITAL_COMMUNITY): Payer: Self-pay | Admitting: Obstetrics and Gynecology

## 2016-10-21 DIAGNOSIS — D62 Acute posthemorrhagic anemia: Secondary | ICD-10-CM

## 2016-10-21 DIAGNOSIS — D509 Iron deficiency anemia, unspecified: Secondary | ICD-10-CM

## 2016-10-21 DIAGNOSIS — O99019 Anemia complicating pregnancy, unspecified trimester: Secondary | ICD-10-CM

## 2016-10-21 HISTORY — DX: Acute posthemorrhagic anemia: D62

## 2016-10-21 HISTORY — DX: Anemia complicating pregnancy, unspecified trimester: D50.9

## 2016-10-21 LAB — COMPREHENSIVE METABOLIC PANEL
ALBUMIN: 2.9 g/dL — AB (ref 3.5–5.0)
ALK PHOS: 100 U/L (ref 38–126)
ALT: 29 U/L (ref 14–54)
AST: 44 U/L — AB (ref 15–41)
Anion gap: 5 (ref 5–15)
BILIRUBIN TOTAL: 0.4 mg/dL (ref 0.3–1.2)
BUN: 14 mg/dL (ref 6–20)
CALCIUM: 9.1 mg/dL (ref 8.9–10.3)
CO2: 26 mmol/L (ref 22–32)
CREATININE: 0.64 mg/dL (ref 0.44–1.00)
Chloride: 107 mmol/L (ref 101–111)
GFR calc Af Amer: >60 mL/min (ref >60)
GFR calc non Af Amer: >60 mL/min (ref >60)
GLUCOSE: 78 mg/dL (ref 65–99)
Potassium: 4.1 mmol/L (ref 3.5–5.1)
Sodium: 138 mmol/L (ref 135–145)
TOTAL PROTEIN: 5.6 g/dL — AB (ref 6.5–8.1)

## 2016-10-21 LAB — CBC
HEMATOCRIT: 23 % — AB (ref 36.0–46.0)
HEMOGLOBIN: 8.2 g/dL — AB (ref 12.0–15.0)
MCH: 31.4 pg (ref 26.0–34.0)
MCHC: 35.7 g/dL (ref 30.0–36.0)
MCV: 88.1 fL (ref 78.0–100.0)
Platelets: 154 10*3/uL (ref 150–400)
RBC: 2.61 MIL/uL — ABNORMAL LOW (ref 3.87–5.11)
RDW: 13.6 % (ref 11.5–15.5)
WBC: 7.7 10*3/uL (ref 4.0–10.5)

## 2016-10-21 LAB — URIC ACID: Uric Acid, Serum: 5.6 mg/dL (ref 2.3–6.6)

## 2016-10-21 MED ORDER — MAGNESIUM 200 MG PO TABS: 200.0000 mg | ORAL_TABLET | Freq: Every day | ORAL | 3 refills | Status: AC

## 2016-10-21 MED ORDER — IBUPROFEN 600 MG PO TABS: 600.0000 mg | ORAL_TABLET | Freq: Four times a day (QID) | ORAL | 0 refills | Status: AC

## 2016-10-21 MED ORDER — OXYCODONE-ACETAMINOPHEN 5-325 MG PO TABS
2.0000 | ORAL_TABLET | ORAL | Status: DC | PRN
  Administered 2016-10-21: 2 via ORAL
  Filled 2016-10-21: qty 2

## 2016-10-21 MED ORDER — LABETALOL HCL 100 MG PO TABS: 100.0000 mg | ORAL_TABLET | Freq: Two times a day (BID) | ORAL | 1 refills | Status: AC

## 2016-10-21 MED ORDER — POLYSACCHARIDE IRON COMPLEX 150 MG PO CAPS: 150.0000 mg | ORAL_CAPSULE | Freq: Every day | ORAL | 3 refills | Status: AC

## 2016-10-21 MED ORDER — LABETALOL HCL 100 MG PO TABS
100.0000 mg | ORAL_TABLET | Freq: Two times a day (BID) | ORAL | Status: DC
  Administered 2016-10-21: 100 mg via ORAL
  Filled 2016-10-21: qty 1

## 2016-10-21 NOTE — Progress Notes (Signed)
Patient ID: Kelsey [name], female   DOB: [birth year], 30 y.o.   MRN: [id number] PPD # 3 SVD  S:  Reports feeling well. Ready for d/c home.             Tolerating po/ No nausea or vomiting             Bleeding is spotting             Pain controlled with ibuprofen (OTC)             Up ad lib / ambulatory / voiding without difficulties     Information for the patient's newborn:  Kelsey [name], Boy Iyonnah [[id number]][[id number]]  female    breast feeding  / Circumcision done   O:  A & O x 3, in no apparent distress              VS:  Vitals:   10/20/16 1724 10/20/16 2130 10/21/16 0130 10/21/16 0512  BP: (!) 149/98 (!) 147/99 (!) 151/99 (!) 138/94  Pulse: 75 69    Resp: 18 18    Temp: 98 F (36.7 C) 97.9 F (36.6 C)    TempSrc: Oral Oral    SpO2:  99%    Weight:      Height:        LABS:   Recent Labs  10/20/16 1236 10/21/16 0956  WBC 7.3 7.7  HGB 8.5* 8.2*  HCT 23.9* 23.0*  PLT 176 154    Blood type: --/--/O POS (10/19 1840)  Rubella: Immune (04/03 0000)   I&O: No intake/output data recorded.          No intake/output data recorded.    Abdomen: soft, non-tender, non-distended             Fundus: firm, non-tender, U-2  Perineum: 2nd degree repair healing well, No edema  Lochia: scant  Extremities: No edema, no calf pain or tenderness    A/P: PPD # 3 29 y.o., [id number]   Principal Problem:   Postpartum care following vaginal delivery (10/20) Active Problems:   Hypertension affecting pregnancy   Postpartum state   Iron deficiency anemia during pregnancy   Acute blood loss anemia    Doing well - stable status  Routine post partum orders  Start Labetalol 100 mg BID  Anticipate discharge today  Return to [facility] for BP re-check in 1 week  *Plan by consult with Dr. Georgeann Oppenheimousins    Chanz Cahall, M, MSN, CNM 10/21/2016, 10:10 AM

## 2016-10-21 NOTE — Discharge Instructions (Signed)
Breast Pumping Tips °If you are breastfeeding, there may be times when you cannot feed your baby directly. Returning to work or going on a trip are common examples. Pumping allows you to store breast milk and feed it to your baby later.  °You may not get much milk when you first start to pump. Your breasts should start to make more after a few days. If you pump at the times you usually feed your baby, you may be able to keep making enough milk to feed your baby without also using formula. The more often you pump, the more milk you will produce.  °WHEN SHOULD I PUMP?  °· You can begin to pump soon after delivery. However, some experts recommend waiting about 4 weeks before giving your infant a bottle to make sure breastfeeding is going well.  °· If you plan to return to work, begin pumping a few weeks before. This will help you develop techniques that work best for you. It also lets you build up a supply of breast milk.   °· When you are with your infant, feed on demand and pump after each feeding.   °· When you are away from your infant for several hours, pump for about 15 minutes every 2-3 hours. Pump both breasts at the same time if you can.   °· If your infant has a formula feeding, make sure to pump around the same time.     °· If you drink any alcohol, wait 2 hours before pumping.   °HOW DO I PREPARE TO PUMP? °Your let-down reflex is the natural reaction to stimulation that makes your breast milk flow. It is easier to stimulate this reflex when you are relaxed. Find relaxation techniques that work for you. If you have difficulty with your let-down reflex, try these methods:  °· Smell one of your infant's blankets or an item of clothing.   °· Look at a picture or video of your infant.   °· Sit in a quiet, private space.   °· Massage the breast you plan to pump.   °· Place soothing warmth on the breast.   °· Play relaxing music.   °WHAT ARE SOME GENERAL BREAST PUMPING TIPS? °· Wash your hands before you pump. You  do not need to wash your nipples or breasts. °· There are three ways to pump. °¨ You can use your hand to massage and compress your breast. °¨ You can use a handheld manual pump. °¨ You can use an electric pump.   °· Make sure the suction cup (flange) on the breast pump is the right size. Place the flange directly over the nipple. If it is the wrong size or placed the wrong way, it may be painful and cause nipple damage.   °· If pumping is uncomfortable, apply a small amount of purified or modified lanolin to your nipple and areola. °· If you are using an electric pump, adjust the speed and suction power to be more comfortable. °· If pumping is painful or if you are not getting very much milk, you may need a different type of pump. A lactation consultant can help you determine what type of pump to use.   °· Keep a full water bottle near you at all times. Drinking lots of fluid helps you make more milk.  °· You can store your milk to use later. Pumped breast milk can be stored in a sealable, sterile container or plastic bag. Label all stored breast milk with the date you pumped it. °¨ Milk can stay out at room temperature for up to 8 hours. °¨   You can store your milk in the refrigerator for up to 8 days. °¨ You can store your milk in the freezer for 3 months. Thaw frozen milk using warm water. Do not put it in the microwave. °· Do not smoke. Smoking can lower your milk supply and harm your infant. If you need help quitting, ask your health care provider to recommend a program.   °WHEN SHOULD I CALL MY HEALTH CARE PROVIDER OR A LACTATION CONSULTANT? °· You are having trouble pumping. °· You are concerned that you are not making enough milk. °· You have nipple pain, soreness, or redness. °· You want to use birth control. Birth control pills may lower your milk supply. Talk to your health care provider about your options. °  °This information is not intended to replace advice given to you by your health care provider.  Make sure you discuss any questions you have with your health care provider. °  °Document Released: 06/05/2010 Document Revised: 12/21/2013 Document Reviewed: 10/08/2013 °[employer]vier Interactive Patient Education ©2016 [employer] °Postpartum Depression and Baby Blues °The postpartum period begins right after the birth of a baby. During this time, there is often a great amount of joy and excitement. It is also a time of many changes in the life of the parents. Regardless of how many times a mother gives birth, each child brings new challenges and dynamics to the family. It is not unusual to have feelings of excitement along with confusing shifts in moods, emotions, and thoughts. All mothers are at risk of developing postpartum depression or the "baby blues." These mood changes can occur right after giving birth, or they may occur many months after giving birth. The baby blues or postpartum depression can be mild or severe. Additionally, postpartum depression can go away rather quickly, or it can be a long-term condition.  °CAUSES °Raised hormone levels and the rapid drop in those levels are thought to be a main cause of postpartum depression and the baby blues. A number of hormones change during and after pregnancy. Estrogen and progesterone usually decrease right after the delivery of your baby. The levels of thyroid hormone and various cortisol steroids also rapidly drop. Other factors that play a role in these mood changes include major life events and genetics.  °RISK FACTORS °If you have any of the following risks for the baby blues or postpartum depression, know what symptoms to watch out for during the postpartum period. Risk factors that may increase the likelihood of getting the baby blues or postpartum depression include: °· Having a personal or family history of depression.   °· Having depression while being pregnant.   °· Having premenstrual mood issues or mood issues related to oral  contraceptives. °· Having a lot of life stress.   °· Having marital conflict.   °· Lacking a social support network.   °· Having a baby with special needs.   °· Having health problems, such as diabetes.   °SIGNS AND SYMPTOMS °Symptoms of baby blues include: °· Brief changes in mood, such as going from extreme happiness to sadness. °· Decreased concentration.   °· Difficulty sleeping.   °· Crying spells, tearfulness.   °· Irritability.   °· Anxiety.   °Symptoms of postpartum depression typically begin within the first month after giving birth. These symptoms include: °· Difficulty sleeping or excessive sleepiness.   °· Marked weight loss.   °· Agitation.   °· Feelings of worthlessness.   °· Lack of interest in activity or food.   °Postpartum psychosis is a very serious condition and can be dangerous. Fortunately, it is   rare. Displaying any of the following symptoms is cause for immediate medical attention. Symptoms of postpartum psychosis include:  °· Hallucinations and delusions.   °· Bizarre or disorganized behavior.   °· Confusion or disorientation.   °DIAGNOSIS  °A diagnosis is made by an evaluation of your symptoms. There are no medical or lab tests that lead to a diagnosis, but there are various questionnaires that a health care provider may use to identify those with the baby blues, postpartum depression, or psychosis. Often, a screening tool called the Edinburgh Postnatal Depression Scale is used to diagnose depression in the postpartum period.  °TREATMENT °The baby blues usually goes away on its own in 1-2 weeks. Social support is often all that is needed. You will be encouraged to get adequate sleep and rest. Occasionally, you may be given medicines to help you sleep.  °Postpartum depression requires treatment because it can last several months or longer if it is not treated. Treatment may include individual or group therapy, medicine, or both to address any social, physiological, and psychological factors  that may play a role in the depression. Regular exercise, a healthy diet, rest, and social support may also be strongly recommended.  °Postpartum psychosis is more serious and needs treatment right away. Hospitalization is often needed. °HOME CARE INSTRUCTIONS °· Get as much rest as you can. Nap when the baby sleeps.   °· Exercise regularly. Some women find yoga and walking to be beneficial.   °· Eat a balanced and nourishing diet.   °· Do little things that you enjoy. Have a cup of tea, take a bubble bath, read your favorite magazine, or listen to your favorite music. °· Avoid alcohol.   °· Ask for help with household chores, cooking, grocery shopping, or running errands as needed. Do not try to do everything.   °· Talk to people close to you about how you are feeling. Get support from your partner, family members, friends, or other new moms. °· Try to stay positive in how you think. Think about the things you are grateful for.   °· Do not spend a lot of time alone.   °· Only take over-the-counter or prescription medicine as directed by your health care provider. °· Keep all your postpartum appointments.   °· Let your health care provider know if you have any concerns.   °SEEK MEDICAL CARE IF: °You are having a reaction to or problems with your medicine. °SEEK IMMEDIATE MEDICAL CARE IF: °· You have suicidal feelings.   °· You think you may harm the baby or someone else. °MAKE SURE YOU: °· Understand these instructions. °· Will watch your condition. °· Will get help right away if you are not doing well or get worse. °  °This information is not intended to replace advice given to you by your health care provider. Make sure you discuss any questions you have with your health care provider. °  °Document Released: 09/19/2004 Document Revised: 12/21/2013 Document Reviewed: 09/27/2013 °Elsevier Interactive Patient Education ©2016 [employer] °Postpartum Care After Vaginal Delivery °After you deliver your newborn  (postpartum period), the usual stay in the hospital is 24-72 hours. If there were problems with your labor or delivery, or if you have other medical problems, you might be in the hospital longer.  °While you are in the hospital, you will receive help and instructions on how to care for yourself and your newborn during the postpartum period.  °While you are in the hospital: °· Be sure to tell your nurses if you have pain or discomfort, as well as   where you feel the pain and what makes the pain worse. °· If you had an incision made near your vagina (episiotomy) or if you had some tearing during delivery, the nurses may put ice packs on your episiotomy or tear. The ice packs may help to reduce the pain and swelling. °· If you are breastfeeding, you may feel uncomfortable contractions of your uterus for a couple of weeks. This is normal. The contractions help your uterus get back to normal size. °· It is normal to have some bleeding after delivery. °¨ For the first 1-3 days after delivery, the flow is red and the amount may be similar to a period. °¨ It is common for the flow to start and stop. °¨ In the first few days, you may pass some small clots. Let your nurses know if you begin to pass large clots or your flow increases. °¨ Do not  flush blood clots down the toilet before having the nurse look at them. °¨ During the next 3-10 days after delivery, your flow should become more watery and pink or brown-tinged in color. °¨ Ten to fourteen days after delivery, your flow should be a small amount of yellowish-white discharge. °¨ The amount of your flow will decrease over the first few weeks after delivery. Your flow may stop in 6-8 weeks. Most women have had their flow stop by 12 weeks after delivery. °· You should change your sanitary pads frequently. °· Wash your hands thoroughly with soap and water for at least 20 seconds after changing pads, using the toilet, or before holding or feeding your newborn. °· You should  feel like you need to empty your bladder within the first 6-8 hours after delivery. °· In case you become weak, lightheaded, or faint, call your nurse before you get out of bed for the first time and before you take a shower for the first time. °· Within the first few days after delivery, your breasts may begin to feel tender and full. This is called engorgement. Breast tenderness usually goes away within 48-72 hours after engorgement occurs. You may also notice milk leaking from your breasts. If you are not breastfeeding, do not stimulate your breasts. Breast stimulation can make your breasts produce more milk. °· Spending as much time as possible with your newborn is very important. During this time, you and your newborn can feel close and get to know each other. Having your newborn stay in your room (rooming in) will help to strengthen the bond with your newborn.  It will give you time to get to know your newborn and become comfortable caring for your newborn. °· Your hormones change after delivery. Sometimes the hormone changes can temporarily cause you to feel sad or tearful. These feelings should not last more than a few days. If these feelings last longer than that, you should talk to your caregiver. °· If desired, talk to your caregiver about methods of family planning or contraception. °· Talk to your caregiver about immunizations. Your caregiver may want you to have the following immunizations before leaving the hospital: °¨ Tetanus, diphtheria, and pertussis (Tdap) or tetanus and diphtheria (Td) immunization. It is very important that you and your family (including grandparents) or others caring for your newborn are up-to-date with the Tdap or Td immunizations. The Tdap or Td immunization can help protect your newborn from getting ill. °¨ Rubella immunization. °¨ Varicella (chickenpox) immunization. °¨ Influenza immunization. You should receive this annual immunization if you did not receive the    immunization during your pregnancy. °  °This information is not intended to replace advice given to you by your health care provider. Make sure you discuss any questions you have with your health care provider. °  °Document Released: 10/13/2007 Document Revised: 09/09/2012 Document Reviewed: 08/12/2012 °[employer]vier Interactive Patient Education ©2016 [employer] °Breastfeeding and Mastitis °Mastitis is inflammation of the breast tissue. It can occur in women who are breastfeeding. This can make breastfeeding painful. Mastitis will sometimes go away on its own. Your health care provider will help determine if treatment is needed. °CAUSES °Mastitis is often associated with a blocked milk (lactiferous) duct. This can happen when too much milk builds up in the breast. Causes of excess milk in the breast can include: °· Poor latch-on. If your baby is not latched onto the breast properly, she or he may not empty your breast completely while breastfeeding. °· Allowing too much time to pass between feedings. °· Wearing a bra or other clothing that is too tight. This puts extra pressure on the lactiferous ducts so milk does not flow through them as it should. °Mastitis can also be caused by a bacterial infection. Bacteria may enter the breast tissue through cuts or openings in the skin. In women who are breastfeeding, this may occur because of cracked or irritated skin. Cracks in the skin are often caused when your baby does not latch on properly to the breast. °SIGNS AND SYMPTOMS °· Swelling, redness, tenderness, and pain in an area of the breast. °· Swelling of the glands under the arm on the same side. °· Fever may or may not accompany mastitis. °If an infection is allowed to progress, a collection of pus (abscess) may develop. °DIAGNOSIS  °Your health care provider can usually diagnose mastitis based on your symptoms and a physical exam. Tests may be done to help confirm the diagnosis. These may include: °· Removal of pus  from the breast by applying pressure to the area. This pus can be examined in the lab to determine which bacteria are present. If an abscess has developed, the fluid in the abscess can be removed with a needle. This can also be used to confirm the diagnosis and determine the bacteria present. In most cases, pus will not be present. °· Blood tests to determine if your body is fighting a bacterial infection. °· Mammogram or ultrasound tests to rule out other problems or diseases. °TREATMENT  °Mastitis that occurs with breastfeeding will sometimes go away on its own. Your health care provider may choose to wait 24 hours after first seeing you to decide whether a prescription medicine is needed. If your symptoms are worse after 24 hours, your health care provider will likely prescribe an antibiotic medicine to treat the mastitis. He or she will determine which bacteria are most likely causing the infection and will then select an appropriate antibiotic medicine. This is sometimes changed based on the results of tests performed to identify the bacteria, or if there is no response to the antibiotic medicine selected. Antibiotic medicines are usually given by mouth. You may also be given medicine for pain. °HOME CARE INSTRUCTIONS °· Only take over-the-counter or prescription medicines for pain, fever, or discomfort as directed by your health care provider. °· If your health care provider prescribed an antibiotic medicine, take the medicine as directed. Make sure you finish it even if you start to feel better. °· Do not wear a tight or underwire bra. Wear a soft, supportive bra. °· Increase your fluid   intake, especially if you have a fever. °· Continue to empty the breast. Your health care provider can tell you whether this milk is safe for your infant or needs to be thrown out. You may be told to stop nursing until your health care provider thinks it is safe for your baby. Use a breast pump if you are advised to stop  nursing. °· Keep your nipples clean and dry. °· Empty the first breast completely before going to the other breast. If your baby is not emptying your breasts completely for some reason, use a breast pump to empty your breasts. °· If you go back to work, pump your breasts while at work to stay in time with your nursing schedule. °· Avoid allowing your breasts to become overly filled with milk (engorged). °SEEK MEDICAL CARE IF: °· You have pus-like discharge from the breast. °· Your symptoms do not improve with the treatment prescribed by your health care provider within 2 days. °SEEK IMMEDIATE MEDICAL CARE IF: °· Your pain and swelling are getting worse. °· You have pain that is not controlled with medicine. °· You have a red line extending from the breast toward your armpit. °· You have a fever or persistent symptoms for more than 2-3 days. °· You have a fever and your symptoms suddenly get worse. °MAKE SURE YOU:  °· Understand these instructions. °· Will watch your condition. °· Will get help right away if you are not doing well or get worse. °  °This information is not intended to replace advice given to you by your health care provider. Make sure you discuss any questions you have with your health care provider. °  °Document Released: 04/12/2005 Document Revised: 12/21/2013 Document Reviewed: 07/22/2013 °[employer]vier Interactive Patient Education ©2016 [employer] ° °Breastfeeding °Deciding to breastfeed is one of the best choices you can make for you and your baby. A change in hormones during pregnancy causes your breast tissue to grow and increases the number and size of your milk ducts. These hormones also allow proteins, sugars, and fats from your blood supply to make breast milk in your milk-producing glands. Hormones prevent breast milk from being released before your baby is born as well as prompt milk flow after birth. Once breastfeeding has begun, thoughts of your baby, as well as his or her sucking or  crying, can stimulate the release of milk from your milk-producing glands.  °BENEFITS OF BREASTFEEDING °For Your Baby °· Your first milk (colostrum) helps your baby's digestive system function better. °· There are antibodies in your milk that help your baby fight off infections. °· Your baby has a lower incidence of asthma, allergies, and sudden infant death syndrome. °· The nutrients in breast milk are better for your baby than infant formulas and are designed uniquely for your baby's needs. °· Breast milk improves your baby's brain development. °· Your baby is less likely to develop other conditions, such as childhood obesity, asthma, or type 2 diabetes mellitus. °For You °· Breastfeeding helps to create a very special bond between you and your baby. °· Breastfeeding is convenient. Breast milk is always available at the correct temperature and costs nothing. °· Breastfeeding helps to burn calories and helps you lose the weight gained during pregnancy. °· Breastfeeding makes your uterus contract to its prepregnancy size faster and slows bleeding (lochia) after you give birth.   °· Breastfeeding helps to lower your risk of developing type 2 diabetes mellitus, osteoporosis, and breast or ovarian cancer later in life. °  SIGNS THAT YOUR BABY IS HUNGRY °Early Signs of Hunger °· Increased alertness or activity. °· Stretching. °· Movement of the head from side to side. °· Movement of the head and opening of the mouth when the corner of the mouth or cheek is stroked (rooting). °· Increased sucking sounds, smacking lips, cooing, sighing, or squeaking. °· Hand-to-mouth movements. °· Increased sucking of fingers or hands. °Late Signs of Hunger °· Fussing. °· Intermittent crying. °Extreme Signs of Hunger °Signs of extreme hunger will require calming and consoling before your baby will be able to breastfeed successfully. Do not wait for the following signs of extreme hunger to occur before you initiate  breastfeeding: °· Restlessness. °· A loud, strong cry. °· Screaming. °BREASTFEEDING BASICS °Breastfeeding Initiation °· Find a comfortable place to sit or lie down, with your neck and back well supported. °· Place a pillow or rolled up blanket under your baby to bring him or her to the level of your breast (if you are seated). Nursing pillows are specially designed to help support your arms and your baby while you breastfeed. °· Make sure that your baby's abdomen is facing your abdomen. °· Gently massage your breast. With your fingertips, massage from your chest wall toward your nipple in a circular motion. This encourages milk flow. You may need to continue this action during the feeding if your milk flows slowly. °· Support your breast with 4 fingers underneath and your thumb above your nipple. Make sure your fingers are well away from your nipple and your baby's mouth. °· Stroke your baby's lips gently with your finger or nipple. °· When your baby's mouth is open wide enough, quickly bring your baby to your breast, placing your entire nipple and as much of the colored area around your nipple (areola) as possible into your baby's mouth. °¨ More areola should be visible above your baby's upper lip than below the lower lip. °¨ Your baby's tongue should be between his or her lower gum and your breast. °· Ensure that your baby's mouth is correctly positioned around your nipple (latched). Your baby's lips should create a seal on your breast and be turned out (everted). °· It is common for your baby to suck about 2-3 minutes in order to start the flow of breast milk. °Latching °Teaching your baby how to latch on to your breast properly is very important. An improper latch can cause nipple pain and decreased milk supply for you and poor weight gain in your baby. Also, if your baby is not latched onto your nipple properly, he or she may swallow some air during feeding. This can make your baby fussy. Burping your baby when  you switch breasts during the feeding can help to get rid of the air. However, teaching your baby to latch on properly is still the best way to prevent fussiness from swallowing air while breastfeeding. °Signs that your baby has successfully latched on to your nipple: °· Silent tugging or silent sucking, without causing you pain. °· Swallowing heard between every 3-4 sucks. °· Muscle movement above and in front of his or her ears while sucking. °Signs that your baby has not successfully latched on to nipple: °· Sucking sounds or smacking sounds from your baby while breastfeeding. °· Nipple pain. °If you think your baby has not latched on correctly, slip your finger into the corner of your baby's mouth to break the suction and place it between your baby's gums. Attempt breastfeeding initiation again. °Signs of Successful Breastfeeding °  Signs from your baby: °· A gradual decrease in the number of sucks or complete cessation of sucking. °· Falling asleep. °· Relaxation of his or her body. °· Retention of a small amount of milk in his or her mouth. °· Letting go of your breast by himself or herself. °Signs from you: °· Breasts that have increased in firmness, weight, and size 1-3 hours after feeding. °· Breasts that are softer immediately after breastfeeding. °· Increased milk volume, as well as a change in milk consistency and color by the fifth day of breastfeeding. °· Nipples that are not sore, cracked, or bleeding. °Signs That Your Baby is Getting Enough Milk °· Wetting at least 3 diapers in a 24-hour period. The urine should be clear and pale yellow by age 5 days. °· At least 3 stools in a 24-hour period by age 5 days. The stool should be soft and yellow. °· At least 3 stools in a 24-hour period by age 7 days. The stool should be seedy and yellow. °· No loss of weight greater than 10% of birth weight during the first 3 days of age. °· Average weight gain of 4-7 ounces (113-198 g) per week after age 4  days. °· Consistent daily weight gain by age 5 days, without weight loss after the age of 2 weeks. °After a feeding, your baby may spit up a small amount. This is common. °BREASTFEEDING FREQUENCY AND DURATION °Frequent feeding will help you make more milk and can prevent sore nipples and breast engorgement. Breastfeed when you feel the need to reduce the fullness of your breasts or when your baby shows signs of hunger. This is called "breastfeeding on demand." Avoid introducing a pacifier to your baby while you are working to establish breastfeeding (the first 4-6 weeks after your baby is born). After this time you may choose to use a pacifier. Research has shown that pacifier use during the first year of a baby's life decreases the risk of sudden infant death syndrome (SIDS). °Allow your baby to feed on each breast as long as he or she wants. Breastfeed until your baby is finished feeding. When your baby unlatches or falls asleep while feeding from the first breast, offer the second breast. Because newborns are often sleepy in the first few weeks of life, you may need to awaken your baby to get him or her to feed. °Breastfeeding times will vary from baby to baby. However, the following rules can serve as a guide to help you ensure that your baby is properly fed: °· Newborns (babies 4 weeks of age or younger) may breastfeed every 1-3 hours. °· Newborns should not go longer than 3 hours during the day or 5 hours during the night without breastfeeding. °· You should breastfeed your baby a minimum of 8 times in a 24-hour period until you begin to introduce solid foods to your baby at around 6 months of age. °BREAST MILK PUMPING °Pumping and storing breast milk allows you to ensure that your baby is exclusively fed your breast milk, even at times when you are unable to breastfeed. This is especially important if you are going back to work while you are still breastfeeding or when you are not able to be present during  feedings. Your lactation consultant can give you guidelines on how long it is safe to store breast milk. °A breast pump is a machine that allows you to pump milk from your breast into a sterile bottle. The pumped breast milk can   then be stored in a refrigerator or freezer. Some breast pumps are operated by hand, while others use electricity. Ask your lactation consultant which type will work best for you. Breast pumps can be purchased, but some hospitals and breastfeeding support groups lease breast pumps on a monthly basis. A lactation consultant can teach you how to hand express breast milk, if you prefer not to use a pump. °CARING FOR YOUR BREASTS WHILE YOU BREASTFEED °Nipples can become dry, cracked, and sore while breastfeeding. The following recommendations can help keep your breasts moisturized and healthy: °· Avoid using soap on your nipples. °· Wear a supportive bra. Although not required, special nursing bras and tank tops are designed to allow access to your breasts for breastfeeding without taking off your entire bra or top. Avoid wearing underwire-style bras or extremely tight bras. °· Air dry your nipples for 3-4 minutes after each feeding. °· Use only cotton bra pads to absorb leaked breast milk. Leaking of breast milk between feedings is normal. °· Use lanolin on your nipples after breastfeeding. Lanolin helps to maintain your skin's normal moisture barrier. If you use pure lanolin, you do not need to wash it off before feeding your baby again. Pure lanolin is not toxic to your baby. You may also hand express a few drops of breast milk and gently massage that milk into your nipples and allow the milk to air dry. °In the first few weeks after giving birth, some women experience extremely full breasts (engorgement). Engorgement can make your breasts feel heavy, warm, and tender to the touch. Engorgement peaks within 3-5 days after you give birth. The following recommendations can help ease  engorgement: °· Completely empty your breasts while breastfeeding or pumping. You may want to start by applying warm, moist heat (in the shower or with warm water-soaked hand towels) just before feeding or pumping. This increases circulation and helps the milk flow. If your baby does not completely empty your breasts while breastfeeding, pump any extra milk after he or she is finished. °· Wear a snug bra (nursing or regular) or tank top for 1-2 days to signal your body to slightly decrease milk production. °· Apply ice packs to your breasts, unless this is too uncomfortable for you. °· Make sure that your baby is latched on and positioned properly while breastfeeding. °If engorgement persists after 48 hours of following these recommendations, contact your health care provider or a lactation consultant. °OVERALL HEALTH CARE RECOMMENDATIONS WHILE BREASTFEEDING °· Eat healthy foods. Alternate between meals and snacks, eating 3 of each per day. Because what you eat affects your breast milk, some of the foods may make your baby more irritable than usual. Avoid eating these foods if you are sure that they are negatively affecting your baby. °· Drink milk, fruit juice, and water to satisfy your thirst (about 10 glasses a day). °· Rest often, relax, and continue to take your prenatal vitamins to prevent fatigue, stress, and anemia. °· Continue breast self-awareness checks. °· Avoid chewing and smoking tobacco. Chemicals from cigarettes that pass into breast milk and exposure to secondhand smoke may harm your baby. °· Avoid alcohol and drug use, including marijuana. °Some medicines that may be harmful to your baby can pass through breast milk. It is important to ask your health care provider before taking any medicine, including all over-the-counter and prescription medicine as well as vitamin and herbal supplements. °It is possible to become pregnant while breastfeeding. If birth control is desired, ask your health care    provider about options that will be safe for your baby. °SEEK MEDICAL CARE IF: °· You feel like you want to stop breastfeeding or have become frustrated with breastfeeding. °· You have painful breasts or nipples. °· Your nipples are cracked or bleeding. °· Your breasts are red, tender, or warm. °· You have a swollen area on either breast. °· You have a fever or chills. °· You have nausea or vomiting. °· You have drainage other than breast milk from your nipples. °· Your breasts do not become full before feedings by the fifth day after you give birth. °· You feel sad and depressed. °· Your baby is too sleepy to eat well. °· Your baby is having trouble sleeping.   °· Your baby is wetting less than 3 diapers in a 24-hour period. °· Your baby has less than 3 stools in a 24-hour period. °· Your baby's skin or the white part of his or her eyes becomes yellow.   °· Your baby is not gaining weight by 5 days of age. °SEEK IMMEDIATE MEDICAL CARE IF: °· Your baby is overly tired (lethargic) and does not want to wake up and feed. °· Your baby develops an unexplained fever. °  °This information is not intended to replace advice given to you by your health care provider. Make sure you discuss any questions you have with your health care provider. °  °Document Released: 12/16/2005 Document Revised: 09/06/2015 Document Reviewed: 06/09/2013 °Elsevier Interactive Patient Education ©2016 [employer] ° °

## 2016-10-21 NOTE — Lactation Note (Signed)
This note was copied from a baby's chart. Lactation Consultation Note  Patient Name: Kelsey Kristie CowmanWinnie Gable HQION'GToday's Date: 10/21/2016 Reason for consult: Follow-up assessment;Hyperbilirubinemia Mom is breast/bottle feeding. Double photo therapy d/c this am. Bili was 13.2 at 0610 this am, rebound bili after phototherapy d/c 12.8 at 1219. Mom is breastfeeding with each feeding then supplementing with formula. Baby has been to breast 5 times in past 24 hours for up to 30 minutes, supplemented 5 times with formula 30-45 ml. 3 voids/4 stools in past 24 hours.  Mom's breasts are starting to fill but not engorged. Assisted Mom with positioning to obtain good depth with latch and assisted with breast massage to soften some areas beginning to become firm between feedings. Engorgement care reviewed with Mom. Advised baby should be at breast 8-12 times in 24 hours and with feeding ques. Pre/post pump as needed to comfort with milk coming in, give baby back any amount of EBM she receives with pumping, ice packs given for comfort. Advised of OP services and support group. Encouraged to call for questions/concerns.   Maternal Data    Feeding Feeding Type: Breast Fed  LATCH Score/Interventions Latch: Grasps breast easily, tongue down, lips flanged, rhythmical sucking. Intervention(s): Adjust position;Assist with latch;Breast massage;Breast compression  Audible Swallowing: Spontaneous and intermittent  Type of Nipple: Everted at rest and after stimulation  Comfort (Breast/Nipple): Filling, red/small blisters or bruises, mild/mod discomfort  Problem noted: Filling;Mild/Moderate discomfort Interventions (Mild/moderate discomfort): Hand massage;Hand expression;Pre-pump if needed;Post-pump  Hold (Positioning): No assistance needed to correctly position infant at breast. Intervention(s): Breastfeeding basics reviewed;Support Pillows;Position options;Skin to skin  LATCH Score: 9  Lactation Tools  Discussed/Used Tools: Pump Breast pump type: Double-Electric Breast Pump   Consult Status Consult Status: Complete Date: 10/21/16 Follow-up type: In-patient    Kelsey [name], Kelsey [name] 10/21/2016, 1:52 PM

## 2016-10-21 NOTE — Discharge Summary (Signed)
OB Discharge Summary     Patient Name: Kelsey [name] DOB: May 30, 1986 MRN: [id number]  Date of admission: 10/17/2016 Delivering MD: Genia Del   Date of discharge: 10/21/2016  Admitting diagnosis: direct admit, 39w  / GHTN Intrauterine pregnancy: [redacted]w[redacted]d     Secondary diagnosis:  Principal Problem:   Postpartum care following vaginal delivery (10/20) Active Problems:   Hypertension affecting pregnancy   Postpartum state   Iron deficiency anemia during pregnancy   Acute blood loss anemia  Additional problems: none     Discharge diagnosis: Term Pregnancy Delivered and Gestational Hypertension                                                                                                Post partum procedures:none  Augmentation: AROM  Complications: None  Hospital course:  Onset of Labor With Vaginal Delivery     30 y.o. yo [id number] at [redacted]w[redacted]d was admitted in Active Labor on 10/17/2016. Patient had an uncomplicated labor course as follows:  Membrane Rupture Time/Date: 9:51 PM ,10/17/2016   Intrapartum Procedures: Episiotomy: None [1]                                         Lacerations:  2nd degree [3]  Patient had a delivery of a Viable infant. 10/18/2016  Information for the patient's newborn:  Lovette Cliche [[id number]]  Delivery Method: Vag-Spont    Pateint had an uncomplicated postpartum course.  She is ambulating, tolerating a regular diet, passing flatus, and urinating well. Patient is discharged home in stable condition on 10/23/16.    Physical exam Vitals:   10/21/16 0130 10/21/16 0512 10/21/16 0900 10/21/16 1430  BP: (!) 151/99 (!) 138/94 (!) 141/84 (!) 155/106  Pulse:    74  Resp:    18  Temp:      TempSrc:      SpO2:      Weight:      Height:       General: alert, cooperative and no distress Lochia: appropriate Uterine Fundus: firm DVT Evaluation: No evidence of DVT seen on physical exam. Negative Homan's sign. No cords or calf  tenderness. Calf/Ankle edema is present Labs: Lab Results  Component Value Date   WBC 7.7 10/21/2016   HGB 8.2 (L) 10/21/2016   HCT 23.0 (L) 10/21/2016   MCV 88.1 10/21/2016   PLT 154 10/21/2016   CMP Latest Ref Rng & Units 10/21/2016  Glucose 65 - 99 mg/dL 78  BUN 6 - 20 mg/dL 14  Creatinine 1.30 - 8.65 mg/dL 7.84  Sodium 696 - 295 mmol/L 138  Potassium 3.5 - 5.1 mmol/L 4.1  Chloride 101 - 111 mmol/L 107  CO2 22 - 32 mmol/L 26  Calcium 8.9 - 10.3 mg/dL 9.1  Total Protein 6.5 - 8.1 g/dL 2.8(U)  Total Bilirubin 0.3 - 1.2 mg/dL 0.4  Alkaline Phos 38 - 126 U/L 100  AST 15 - 41 U/L 44(H)  ALT 14 - 54 U/L 29  Discharge instruction: per After Visit Summary and "Baby and Me Booklet".  After visit meds: PNV / Labetalol 100 mg BID / Ibuprofen 600 mg every 6hrs   Diet: routine diet  Activity: Advance as tolerated. Pelvic rest for 6 weeks.   Outpatient follow up:6 weeks Follow up Appt:No future appointments. Follow up Visit:No Follow-up on file.  Postpartum contraception: Undecided  Newborn Data: Live born female 10/18/2016 Birth Weight: 8 lb 3.9 oz (3740 g) APGAR: 8, 9  Baby Feeding: Breast Disposition:home with mother   10/21/2016 Raelyn MoraAWSON, Tc [place], Judie PetitM, CNM

## 2016-11-19 ENCOUNTER — Other Ambulatory Visit (HOSPITAL_COMMUNITY): Payer: Self-pay | Admitting: Lactation Services

## 2016-11-19 ENCOUNTER — Encounter (HOSPITAL_COMMUNITY)
Admission: RE | Admit: 2016-11-19 | Discharge: 2016-11-19 | Disposition: A | Discharge status: Home / Self Care | Payer: BC Managed Care – PPO | Source: Ambulatory Visit | Attending: Obstetrics & Gynecology | Admitting: Obstetrics & Gynecology

## 2016-11-19 NOTE — Lactation Note (Addendum)
Lactation Consult  Mother's reason for visit: Get sized for a nipple shield due to sore nipples Visit Type:  Out patient Feeding assessment Appointment Notes:  Mom concerned that BF is painful intermittently. She has also been experiencing plugged ducts the last few weeks. Findings today indicate an overactive letdown and an abundant supply of milk. Infant was having difficulty with overactive letdown and would pull off the breast. Mom was given pointers and plan to help down regulate supply some. Infant is noted to have an upper lip tie that may be contributing to nipple pain. Nipple tissue noted to be intact. Did not recommend a NS at this time.  Consult:  Initial Lactation Consultant:  Kelsey BlalockSharon S [name]    ________________________________________________________________________ Kelsey FloresBaby's Name:  Kelsey [name] Kelsey [name] Date of Birth:  [birth year]/18/2018 Pediatrician:  Dr. Jolaine [name]  Gender:  female Gestational Age: 672w1d (At Birth) Birth Weight:  8 lb 3.9 oz (3740 g) Weight at Discharge:  Weight: 7 lb 10.9 oz (3485 g)             Date of Discharge:  10/21/2016      Filed Weights   10/19/16 0021 10/20/16 0015 10/21/16 0056  Weight: 7 lb 15.7 oz (3620 g) 7 lb 12.5 oz (3530 g) 7 lb 10.9 oz (3485 g)  [facility]:  10 lb 1.4 oz (per mom)   Location:Pediatrician's office Weight today:  10 pounds 9.5 oz (4805 grams)    ________________________________________________________________________  Mother's Name: Kelsey [name] Type of delivery:   Breastfeeding Experience:  "It is a lot of work, some days are better than others" Maternal Medications: Labetalol, PNV, Iron Supplements  ________________________________________________________________________  Breastfeeding History (Post Discharge)  Frequency of breastfeeding:  On demand- at least every 2-3 hours   Duration of feeding:  10-25 minutes  Patient does not supplement or pump.  Infant Intake and  Output Assessment  Voids:  10 in 24 hrs.  Color:  Clear yellow Stools:  5+ in 24 hrs.  Color:  Yellow  ________________________________________________________________________  Maternal Breast Assessment  Breast:  Full Nipple:  Erect Pain level:  0 Pain interventions:  Cream/oil and Expressed breast milk-Mom using EBM first followed by Boob Ease Nipple Cream  _______________________________________________________________________ Feeding Assessment/Evaluation- Infant fed actively. He was noted to choke with letdown and pull off, mom allowed let down to run into towel and then relatched infant. Mom was noted to have 4 letdowns within the 15 minutes feeding. She reports her breasts are never fully empty. She comfort pumps/hand expresses as needed. She has had a few episodes of plugged ducts over the past 2 weeks, I suspect from over supply. Enc her to not add pumping at this time. We did discuss eliciting the first letdown into a towel or collect for future use before latching infant. She is BF infant only on one side/ feeding. Breast is softening but not emptying. Mom reports that some feedings are painful to her nipples and others are not. She notes that sometimes her nipple is pinched after feeding, but not always. This feeding was without pain or discomfort for her. Her nipple was round at end of feeding. Enc her to rotate BF positions with feedings. Mom reports she pumps occasionally to give a bottle when needing to be separated from infant. Mom reports she had an oversupply and multiple plugged ducts with her first child and did a lot of extra pumping, she reports she does not want to spend a lot  of time pumping this time. Discussed pros/cons of a Nipple shield and it was not recommended at this time.   Initial feeding assessment:  Infant's oral assessment:  Variance Infant noted to have an upper labial frenulum that anchors in the bottom center of the upper gumline. Mom reports she has  discussed with her Pediatrician. Mom was asking about a second opinion, gave her Tongue/ Lip Tie resources Handout. Infant is noted to have a sucking blister to middle of upper lip. Mom reports she often has to manually flange lips with feeding. She notes that plugged ducts were noted to bottom of nipple, not near lip tie area.   Positioning:  Cross cradle Left breast  LATCH documentation:  Latch:  2 = Grasps breast easily, tongue down, lips flanged, rhythmical sucking.  Audible swallowing:  2 = Spontaneous and intermittent  Type of nipple:  2 = Everted at rest and after stimulation  Comfort (Breast/Nipple):  2 = Soft / non-tender  Hold (Positioning):  2 = No assistance needed to correctly position infant at breast  LATCH score:  10  Attached assessment:  Deep  Lips flanged:  No.  Lips untucked:  Yes.    Suck assessment:  Nutritive  Tools:  none Instructed on use and cleaning of tool:  n/a  Pre-feed weight:  4805 g  (10 lb. 9.5 oz.) Post-feed weight:  4874 g (10 lb. 11.9 oz.) Amount transferred:  69 ml Amount supplemented:  0 ml  No    Total amount transferred:  69 ml Total supplement given:  0 ml  Plan:   1. Continue to feed infant on demand-to down regulate supply try feeding 2 feedings on one breast then 2 feedings on other breast for about 12 hours. Comfort pump/Hand express the other side as needed.  2. After 12 hours go back to feeding on one side per feeding, alternating breasts with each feeding.  3. Allow first letdown to run into a towel or collect for future use if infant still having trouble with let down. 4. Feed in the laid back position 5. Flange lips with feeding as needed 6. Rotate BF positions with feedings.  7. Review Lecithin on [email or web address] with OB if plugged ducts continue

## 2021-02-15 ENCOUNTER — Other Ambulatory Visit: Payer: Self-pay | Admitting: Internal Medicine

## 2021-02-15 DIAGNOSIS — R1011 Right upper quadrant pain: Secondary | ICD-10-CM

## 2021-02-16 ENCOUNTER — Ambulatory Visit
Admission: RE | Admit: 2021-02-16 | Discharge: 2021-02-16 | Disposition: A | Discharge status: Home / Self Care | Payer: BC Managed Care – PPO | Source: Ambulatory Visit | Attending: Internal Medicine | Admitting: Internal Medicine

## 2021-02-16 DIAGNOSIS — R1011 Right upper quadrant pain: Secondary | ICD-10-CM

## 2021-02-19 ENCOUNTER — Other Ambulatory Visit: Payer: Self-pay | Admitting: Internal Medicine

## 2021-02-19 DIAGNOSIS — R109 Unspecified abdominal pain: Secondary | ICD-10-CM

## 2021-02-20 ENCOUNTER — Ambulatory Visit
Admission: RE | Admit: 2021-02-20 | Discharge: 2021-02-20 | Disposition: A | Discharge status: Home / Self Care | Payer: BC Managed Care – PPO | Source: Ambulatory Visit | Attending: Internal Medicine | Admitting: Internal Medicine

## 2021-02-20 ENCOUNTER — Other Ambulatory Visit: Payer: Self-pay

## 2021-02-20 DIAGNOSIS — R109 Unspecified abdominal pain: Secondary | ICD-10-CM

## 2021-02-20 MED ORDER — IOPAMIDOL (ISOVUE-300) INJECTION 61%
100.0000 mL | Freq: Once | INTRAVENOUS | Status: AC | PRN
  Administered 2021-02-20: 100 mL via INTRAVENOUS

## 2021-05-29 IMAGING — CT CT ABDOMEN W/ CM
2 of 5 series · 15 of 46 positions shown, 17 images · IV contrast (iopamidol)
Comparison: Ultrasound 02/16/2021

CLINICAL DATA: Acute right upper quadrant pain and upper back pain
for 6 days.

EXAM:
CT ABDOMEN WITH CONTRAST
TECHNIQUE: Multidetector CT imaging of the abdomen was performed using the
standard protocol following bolus administration of intravenous
contrast.
CONTRAST:  100mL [id number] IOPAMIDOL (WHC5BW-T88) INJECTION 61%

[Series 2: abd with 5.00 br40 s3 axial · axial · 0.59mm/px · z∈[+1449,+1669]mm · 12 of 52 slices shown, 14 images]
[im 4/52  soft-tissue]
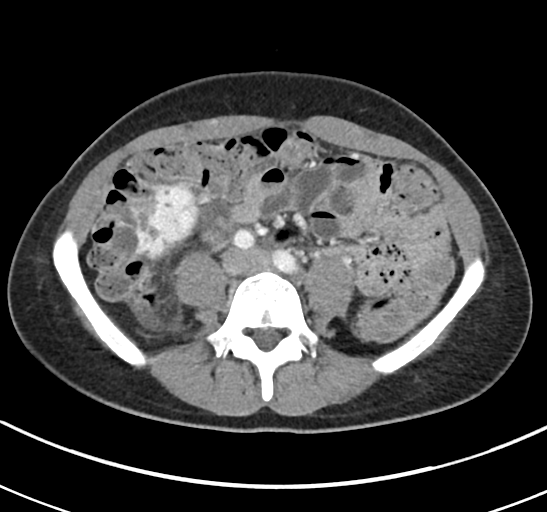
[im 4/52  bone]
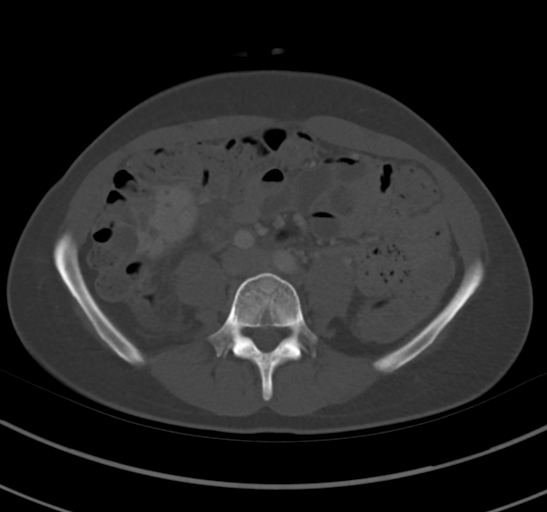
[im 8/52  soft-tissue]
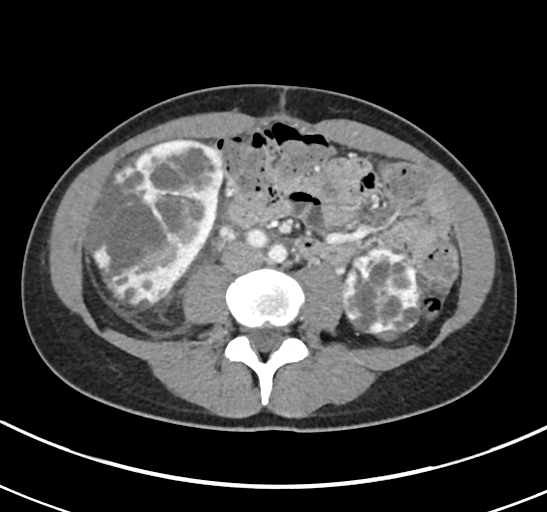
[im 11/52  soft-tissue]
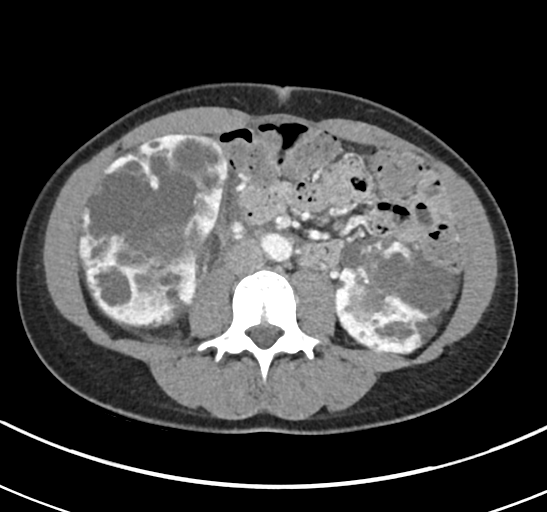
[im 15/52  soft-tissue]
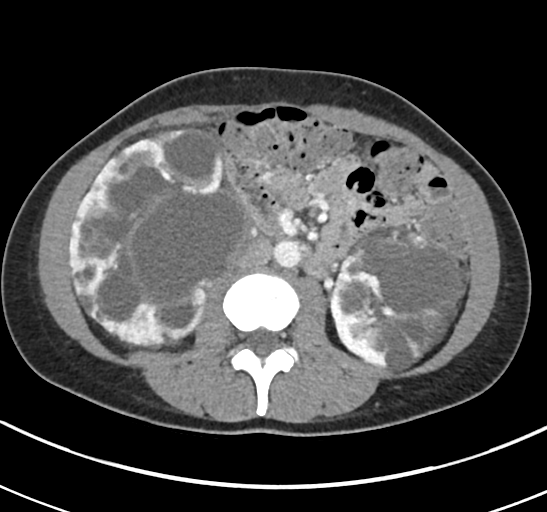
[im 19/52  soft-tissue]
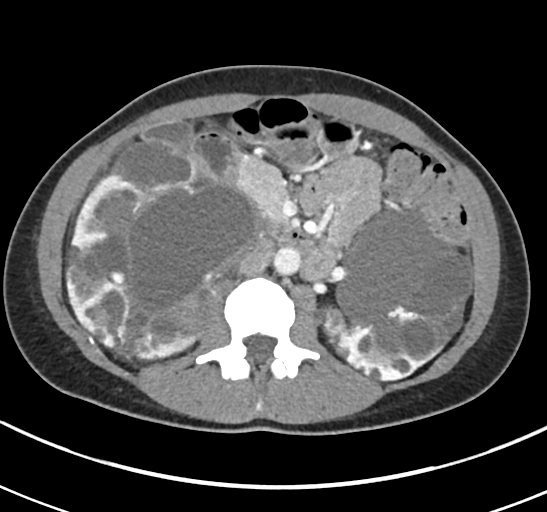
[im 22/52  soft-tissue]
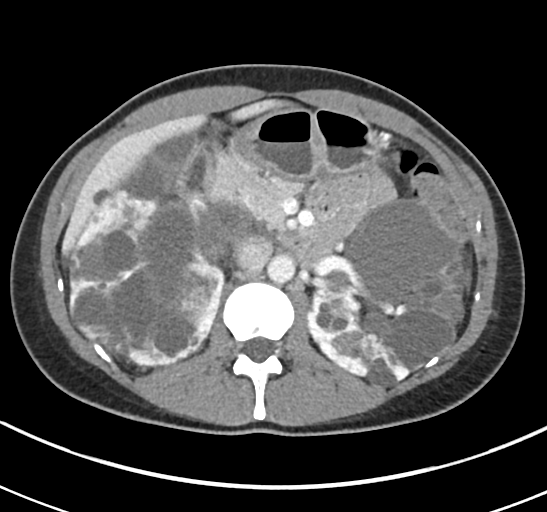
[im 30/52  soft-tissue]
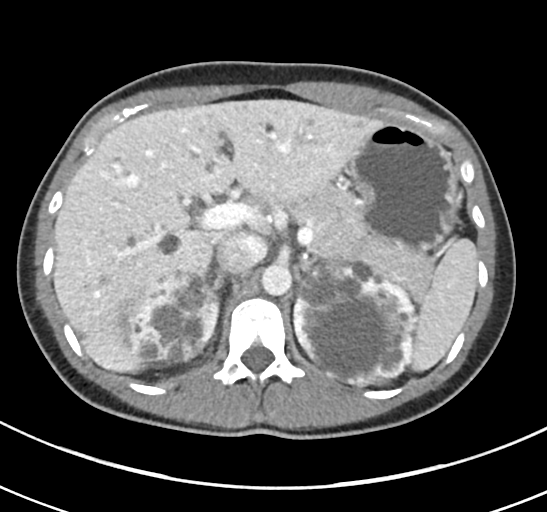
[im 33/52  soft-tissue]
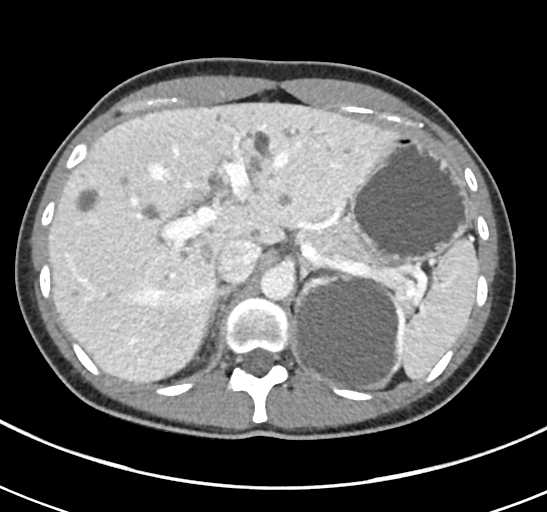
[im 37/52  soft-tissue]
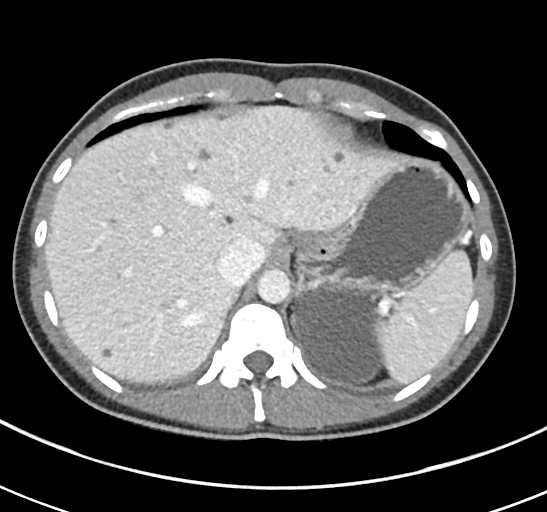
[im 37/52  bone]
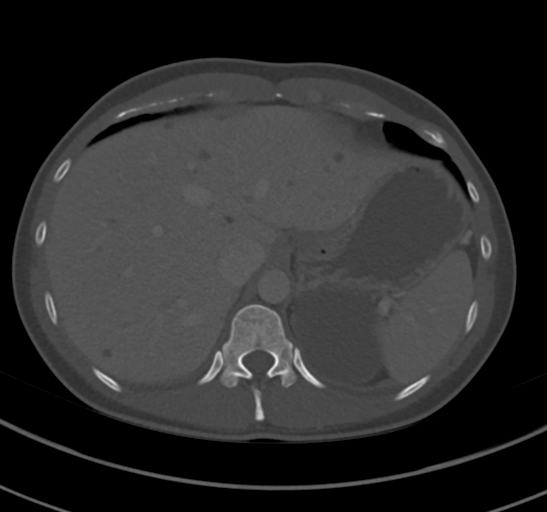
[im 41/52  soft-tissue]
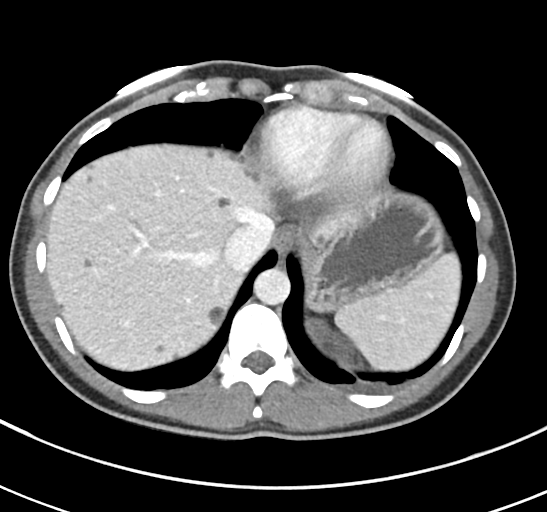
[im 44/52  soft-tissue]
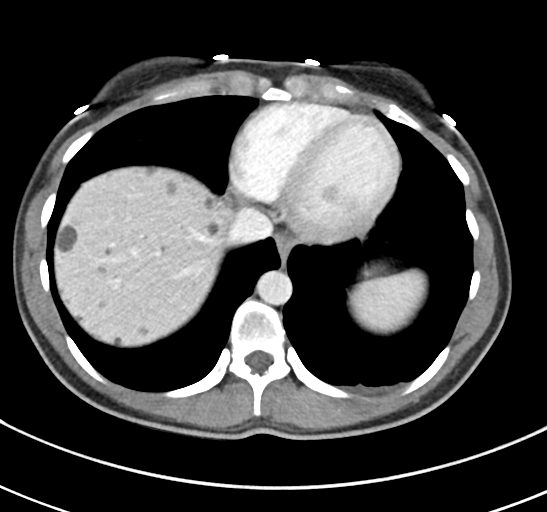
[im 48/52  soft-tissue]
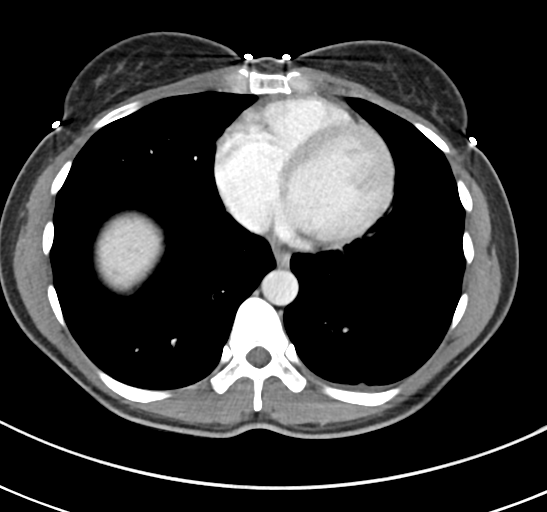

[Series 8: abd with 2.00 br40 s3 cor · coronal · 0.51mm/px · 3 of 150 slices shown]
[im 50/150  soft-tissue]
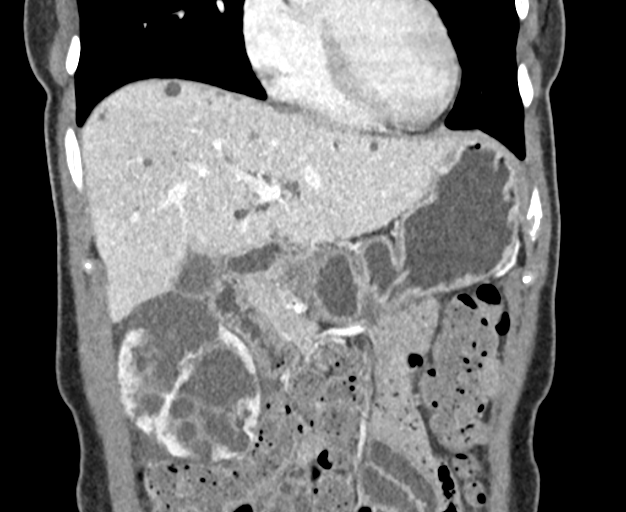
[im 67/150  soft-tissue]
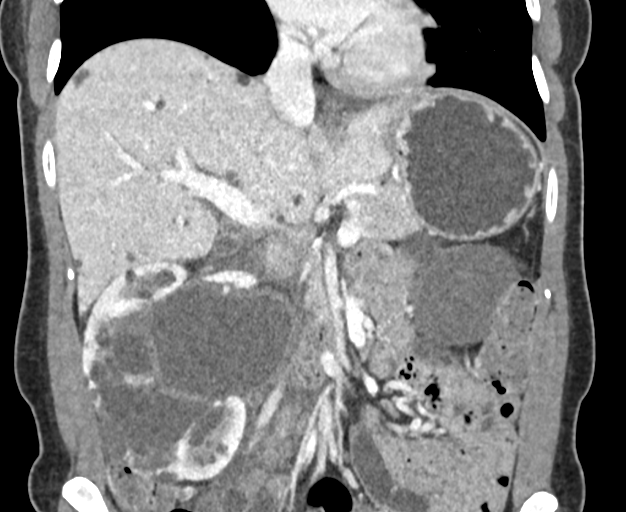
[im 83/150  soft-tissue]
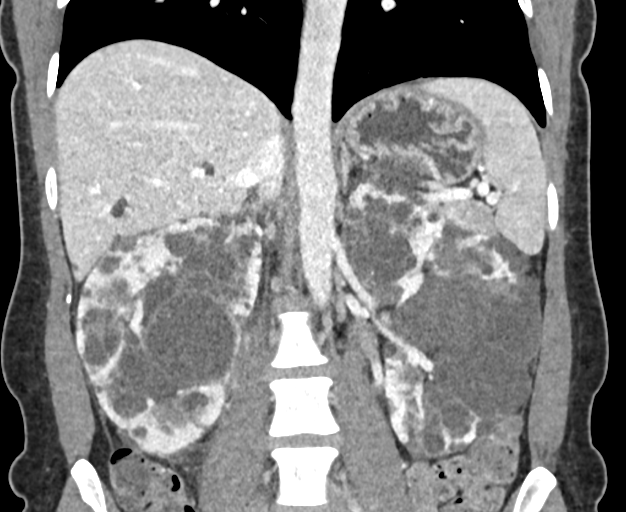

[15 of 46 positions shown; findings below may reference images not displayed]

FINDINGS: Lower chest: No acute abnormality.

Hepatobiliary: There are innumerable small cysts involving both
lobes of liver measuring up to 1.6 cm. The gallbladder appears
normal. No biliary ductal dilatation.

Pancreas: Unremarkable. No pancreatic ductal dilatation or
surrounding inflammatory changes.

Spleen: Normal in size without focal abnormality.

Adrenals/Urinary Tract: Normal appearance of the adrenal glands.
Changes of polycystic kidney disease is identified bilaterally.
Innumerable cysts of varying complexity are identified throughout
both kidneys. Mild scratch set right-sided perinephric soft tissue
stranding is noted with a small volume of free fluid within the
right retroperitoneum posterior to the inferior pole. Right renal
collecting system and right ureter are difficult to visualize due to
presence of multiple large cysts. There are a few dilated calices
within the right kidney which contain contrast fluid levels, image
[DATE].

Stomach/Bowel: The stomach appears normal. No dilated loops of large
or small bowel. Large stool burden noted throughout the colon.

Vascular/Lymphatic: No significant vascular findings are present. No
enlarged abdominal or pelvic lymph nodes.

Other: No focal fluid collections identified.

Musculoskeletal: No acute or significant osseous findings.
IMPRESSION: 1. Polycystic kidneys. Numerous cysts of varying complexity are
noted within both kidneys. Nonemergent MR of the abdomen without and
with contrast material may be helpful to further characterize
complex cysts.
2. There are a few dilated calices within the right kidney which
contain contrast fluid levels. Additionally, there is mild
right-sided perinephric soft tissue stranding and small volume of
free fluid within the right retroperitoneum posterior to the
inferior pole of the right kidney. Primary differential
considerations include hydronephrosis versus pyelonephritis.
Although no obstructing calculi is noted within the abdomen the mid
and distal ureters and urinary bladder are not included on this
study.
3. Large stool burden noted throughout the colon. Correlate for any
clinical signs or symptoms of constipation.
4. These results will be called to the ordering clinician or
representative by the Radiologist Assistant, and communication
documented in the PACS or [REDACTED].

## 2021-06-27 ENCOUNTER — Other Ambulatory Visit: Payer: Self-pay | Admitting: Internal Medicine

## 2021-06-27 DIAGNOSIS — Q613 Polycystic kidney, unspecified: Secondary | ICD-10-CM

## 2021-06-27 DIAGNOSIS — N281 Cyst of kidney, acquired: Secondary | ICD-10-CM

## 2021-07-17 ENCOUNTER — Other Ambulatory Visit: Payer: BC Managed Care – PPO

## 2021-07-25 ENCOUNTER — Ambulatory Visit
Admission: RE | Admit: 2021-07-25 | Discharge: 2021-07-25 | Disposition: A | Discharge status: Home / Self Care | Payer: BC Managed Care – PPO | Source: Ambulatory Visit | Attending: Internal Medicine | Admitting: Internal Medicine

## 2021-07-25 ENCOUNTER — Other Ambulatory Visit: Payer: Self-pay

## 2021-07-25 DIAGNOSIS — Q613 Polycystic kidney, unspecified: Secondary | ICD-10-CM

## 2021-07-25 DIAGNOSIS — N281 Cyst of kidney, acquired: Secondary | ICD-10-CM

## 2021-07-25 MED ORDER — GADOBENATE DIMEGLUMINE 529 MG/ML IV SOLN
12.0000 mL | Freq: Once | INTRAVENOUS | Status: AC | PRN
  Administered 2021-07-25: 12 mL via INTRAVENOUS

## 2023-05-22 ENCOUNTER — Encounter: Payer: Self-pay | Admitting: Internal Medicine

## 2023-05-23 ENCOUNTER — Other Ambulatory Visit: Payer: Self-pay | Admitting: Internal Medicine

## 2023-05-23 DIAGNOSIS — R42 Dizziness and giddiness: Secondary | ICD-10-CM

## 2023-05-23 DIAGNOSIS — N281 Cyst of kidney, acquired: Secondary | ICD-10-CM

## 2023-05-23 DIAGNOSIS — Q613 Polycystic kidney, unspecified: Secondary | ICD-10-CM
